# Patient Record
Sex: Male | Born: 1950
Health system: Southern US, Community
[De-identification: ages and names within clinical notes are randomized; demographics above are authoritative.]

## PROBLEM LIST (undated history)

## (undated) ENCOUNTER — Ambulatory Visit: Discharge status: Absent without leave | Payer: Medicare Other

## (undated) DIAGNOSIS — C801 Malignant (primary) neoplasm, unspecified: Secondary | ICD-10-CM

## (undated) DIAGNOSIS — I10 Essential (primary) hypertension: Secondary | ICD-10-CM

## (undated) DIAGNOSIS — J387 Other diseases of larynx: Secondary | ICD-10-CM

## (undated) DIAGNOSIS — Z972 Presence of dental prosthetic device (complete) (partial): Secondary | ICD-10-CM

## (undated) DIAGNOSIS — I499 Cardiac arrhythmia, unspecified: Secondary | ICD-10-CM

## (undated) DIAGNOSIS — H269 Unspecified cataract: Secondary | ICD-10-CM

## (undated) HISTORY — PX: HERNIA REPAIR: SHX51

## (undated) HISTORY — DX: Unspecified cataract: H26.9

## (undated) HISTORY — PX: CATARACT EXTRACTION, BILATERAL: SHX1313

---

## 2008-05-25 ENCOUNTER — Ambulatory Visit: Payer: Self-pay | Admitting: Gastroenterology

## 2012-01-23 ENCOUNTER — Ambulatory Visit: Payer: Self-pay | Admitting: Ophthalmology

## 2012-02-20 ENCOUNTER — Ambulatory Visit: Payer: Self-pay | Admitting: Ophthalmology

## 2016-05-21 HISTORY — PX: MELANOMA EXCISION: SHX5266

## 2016-10-11 DIAGNOSIS — L578 Other skin changes due to chronic exposure to nonionizing radiation: Secondary | ICD-10-CM | POA: Diagnosis not present

## 2016-10-11 DIAGNOSIS — R208 Other disturbances of skin sensation: Secondary | ICD-10-CM | POA: Diagnosis not present

## 2016-10-11 DIAGNOSIS — C434 Malignant melanoma of scalp and neck: Secondary | ICD-10-CM | POA: Diagnosis not present

## 2016-10-11 DIAGNOSIS — C4441 Basal cell carcinoma of skin of scalp and neck: Secondary | ICD-10-CM | POA: Diagnosis not present

## 2016-10-11 DIAGNOSIS — D485 Neoplasm of uncertain behavior of skin: Secondary | ICD-10-CM | POA: Diagnosis not present

## 2016-10-11 DIAGNOSIS — Z1283 Encounter for screening for malignant neoplasm of skin: Secondary | ICD-10-CM | POA: Diagnosis not present

## 2016-10-17 DIAGNOSIS — C434 Malignant melanoma of scalp and neck: Secondary | ICD-10-CM | POA: Diagnosis not present

## 2016-10-17 DIAGNOSIS — Z85828 Personal history of other malignant neoplasm of skin: Secondary | ICD-10-CM | POA: Diagnosis not present

## 2016-10-23 DIAGNOSIS — C4441 Basal cell carcinoma of skin of scalp and neck: Secondary | ICD-10-CM | POA: Diagnosis not present

## 2016-10-23 DIAGNOSIS — C434 Malignant melanoma of scalp and neck: Secondary | ICD-10-CM | POA: Diagnosis not present

## 2016-10-24 DIAGNOSIS — C4359 Malignant melanoma of other part of trunk: Secondary | ICD-10-CM | POA: Diagnosis not present

## 2016-11-06 DIAGNOSIS — C4359 Malignant melanoma of other part of trunk: Secondary | ICD-10-CM | POA: Diagnosis not present

## 2016-11-06 DIAGNOSIS — C4361 Malignant melanoma of right upper limb, including shoulder: Secondary | ICD-10-CM | POA: Diagnosis not present

## 2016-11-06 DIAGNOSIS — C434 Malignant melanoma of scalp and neck: Secondary | ICD-10-CM | POA: Diagnosis not present

## 2016-11-06 DIAGNOSIS — I10 Essential (primary) hypertension: Secondary | ICD-10-CM | POA: Diagnosis not present

## 2016-11-20 DIAGNOSIS — C434 Malignant melanoma of scalp and neck: Secondary | ICD-10-CM | POA: Diagnosis not present

## 2016-11-20 DIAGNOSIS — Z09 Encounter for follow-up examination after completed treatment for conditions other than malignant neoplasm: Secondary | ICD-10-CM | POA: Diagnosis not present

## 2016-12-18 DIAGNOSIS — Z09 Encounter for follow-up examination after completed treatment for conditions other than malignant neoplasm: Secondary | ICD-10-CM | POA: Diagnosis not present

## 2016-12-18 DIAGNOSIS — C434 Malignant melanoma of scalp and neck: Secondary | ICD-10-CM | POA: Diagnosis not present

## 2017-01-22 DIAGNOSIS — Z09 Encounter for follow-up examination after completed treatment for conditions other than malignant neoplasm: Secondary | ICD-10-CM | POA: Diagnosis not present

## 2017-01-22 DIAGNOSIS — C434 Malignant melanoma of scalp and neck: Secondary | ICD-10-CM | POA: Diagnosis not present

## 2017-09-12 ENCOUNTER — Encounter: Payer: Self-pay | Admitting: Anesthesiology

## 2017-09-12 DIAGNOSIS — D380 Neoplasm of uncertain behavior of larynx: Secondary | ICD-10-CM | POA: Diagnosis not present

## 2017-09-12 DIAGNOSIS — R070 Pain in throat: Secondary | ICD-10-CM | POA: Diagnosis not present

## 2017-09-16 NOTE — Discharge Instructions (Signed)
General Anesthesia, Adult, Care After °These instructions provide you with information about caring for yourself after your procedure. Your health care provider may also give you more specific instructions. Your treatment has been planned according to current medical practices, but problems sometimes occur. Call your health care provider if you have any problems or questions after your procedure. °What can I expect after the procedure? °After the procedure, it is common to have: °· Vomiting. °· A sore throat. °· Mental slowness. ° °It is common to feel: °· Nauseous. °· Cold or shivery. °· Sleepy. °· Tired. °· Sore or achy, even in parts of your body where you did not have surgery. ° °Follow these instructions at home: °For at least 24 hours after the procedure: °· Do not: °? Participate in activities where you could fall or become injured. °? Drive. °? Use heavy machinery. °? Drink alcohol. °? Take sleeping pills or medicines that cause drowsiness. °? Make important decisions or sign legal documents. °? Take care of children on your own. °· Rest. °Eating and drinking °· If you vomit, drink water, juice, or soup when you can drink without vomiting. °· Drink enough fluid to keep your urine clear or pale yellow. °· Make sure you have little or no nausea before eating solid foods. °· Follow the diet recommended by your health care provider. °General instructions °· Have a responsible adult stay with you until you are awake and alert. °· Return to your normal activities as told by your health care provider. Ask your health care provider what activities are safe for you. °· Take over-the-counter and prescription medicines only as told by your health care provider. °· If you smoke, do not smoke without supervision. °· Keep all follow-up visits as told by your health care provider. This is important. °Contact a health care provider if: °· You continue to have nausea or vomiting at home, and medicines are not helpful. °· You  cannot drink fluids or start eating again. °· You cannot urinate after 8-12 hours. °· You develop a skin rash. °· You have fever. °· You have increasing redness at the site of your procedure. °Get help right away if: °· You have difficulty breathing. °· You have chest pain. °· You have unexpected bleeding. °· You feel that you are having a life-threatening or urgent problem. °This information is not intended to replace advice given to you by your health care provider. Make sure you discuss any questions you have with your health care provider. °Document Released: 08/13/2000 Document Revised: 10/10/2015 Document Reviewed: 04/21/2015 °Elsevier Interactive Patient Education © 2018 [employer] ° °

## 2017-09-17 ENCOUNTER — Ambulatory Visit: Admission: RE | Admit: 2017-09-17 | Payer: Medicare Other | Source: Ambulatory Visit | Admitting: Otolaryngology

## 2017-09-17 HISTORY — DX: Essential (primary) hypertension: I10

## 2017-09-17 SURGERY — LARYNGOSCOPY, DIRECT
Anesthesia: General

## 2017-09-25 DIAGNOSIS — I493 Ventricular premature depolarization: Secondary | ICD-10-CM | POA: Diagnosis not present

## 2017-09-25 DIAGNOSIS — I10 Essential (primary) hypertension: Secondary | ICD-10-CM | POA: Diagnosis not present

## 2017-10-02 ENCOUNTER — Other Ambulatory Visit: Payer: Self-pay

## 2017-10-03 NOTE — Discharge Instructions (Signed)
General Anesthesia, Adult, Care After °These instructions provide you with information about caring for yourself after your procedure. Your health care provider may also give you more specific instructions. Your treatment has been planned according to current medical practices, but problems sometimes occur. Call your health care provider if you have any problems or questions after your procedure. °What can I expect after the procedure? °After the procedure, it is common to have: °· Vomiting. °· A sore throat. °· Mental slowness. ° °It is common to feel: °· Nauseous. °· Cold or shivery. °· Sleepy. °· Tired. °· Sore or achy, even in parts of your body where you did not have surgery. ° °Follow these instructions at home: °For at least 24 hours after the procedure: °· Do not: °? Participate in activities where you could fall or become injured. °? Drive. °? Use heavy machinery. °? Drink alcohol. °? Take sleeping pills or medicines that cause drowsiness. °? Make important decisions or sign legal documents. °? Take care of children on your own. °· Rest. °Eating and drinking °· If you vomit, drink water, juice, or soup when you can drink without vomiting. °· Drink enough fluid to keep your urine clear or pale yellow. °· Make sure you have little or no nausea before eating solid foods. °· Follow the diet recommended by your health care provider. °General instructions °· Have a responsible adult stay with you until you are awake and alert. °· Return to your normal activities as told by your health care provider. Ask your health care provider what activities are safe for you. °· Take over-the-counter and prescription medicines only as told by your health care provider. °· If you smoke, do not smoke without supervision. °· Keep all follow-up visits as told by your health care provider. This is important. °Contact a health care provider if: °· You continue to have nausea or vomiting at home, and medicines are not helpful. °· You  cannot drink fluids or start eating again. °· You cannot urinate after 8-12 hours. °· You develop a skin rash. °· You have fever. °· You have increasing redness at the site of your procedure. °Get help right away if: °· You have difficulty breathing. °· You have chest pain. °· You have unexpected bleeding. °· You feel that you are having a life-threatening or urgent problem. °This information is not intended to replace advice given to you by your health care provider. Make sure you discuss any questions you have with your health care provider. °Document Released: 08/13/2000 Document Revised: 10/10/2015 Document Reviewed: 04/21/2015 °Elsevier Interactive Patient Education © 2018 [employer] ° °

## 2017-10-08 ENCOUNTER — Encounter
Admission: RE | Disposition: A | Discharge status: Home / Self Care | Payer: Self-pay | Source: Ambulatory Visit | Attending: Otolaryngology

## 2017-10-08 ENCOUNTER — Ambulatory Visit
Admission: RE | Admit: 2017-10-08 | Discharge: 2017-10-08 | Disposition: A | Discharge status: Home / Self Care | Payer: Medicare Other | Source: Ambulatory Visit | Attending: Otolaryngology | Admitting: Otolaryngology

## 2017-10-08 ENCOUNTER — Ambulatory Visit: Payer: Medicare Other | Admitting: Anesthesiology

## 2017-10-08 DIAGNOSIS — Z85820 Personal history of malignant melanoma of skin: Secondary | ICD-10-CM | POA: Diagnosis not present

## 2017-10-08 DIAGNOSIS — F17200 Nicotine dependence, unspecified, uncomplicated: Secondary | ICD-10-CM | POA: Insufficient documentation

## 2017-10-08 DIAGNOSIS — D489 Neoplasm of uncertain behavior, unspecified: Secondary | ICD-10-CM | POA: Diagnosis not present

## 2017-10-08 DIAGNOSIS — Z79899 Other long term (current) drug therapy: Secondary | ICD-10-CM | POA: Insufficient documentation

## 2017-10-08 DIAGNOSIS — D380 Neoplasm of uncertain behavior of larynx: Secondary | ICD-10-CM | POA: Diagnosis not present

## 2017-10-08 DIAGNOSIS — J387 Other diseases of larynx: Secondary | ICD-10-CM | POA: Diagnosis not present

## 2017-10-08 DIAGNOSIS — C321 Malignant neoplasm of supraglottis: Secondary | ICD-10-CM | POA: Diagnosis not present

## 2017-10-08 DIAGNOSIS — I10 Essential (primary) hypertension: Secondary | ICD-10-CM | POA: Diagnosis not present

## 2017-10-08 HISTORY — DX: Malignant (primary) neoplasm, unspecified: C80.1

## 2017-10-08 HISTORY — DX: Cardiac arrhythmia, unspecified: I49.9

## 2017-10-08 HISTORY — PX: DIRECT LARYNGOSCOPY: SHX5326

## 2017-10-08 HISTORY — DX: Other diseases of larynx: J38.7

## 2017-10-08 HISTORY — DX: Presence of dental prosthetic device (complete) (partial): Z97.2

## 2017-10-08 SURGERY — LARYNGOSCOPY, DIRECT
Anesthesia: General | Wound class: Clean Contaminated

## 2017-10-08 MED ORDER — ACETAMINOPHEN 325 MG PO TABS: 325.0000 mg | ORAL_TABLET | ORAL | Status: DC | PRN

## 2017-10-08 MED ORDER — FENTANYL CITRATE (PF) 100 MCG/2ML IJ SOLN
INTRAMUSCULAR | Status: DC | PRN
  Administered 2017-10-08: 50 ug via INTRAVENOUS

## 2017-10-08 MED ORDER — PROPOFOL 10 MG/ML IV BOLUS
INTRAVENOUS | Status: DC | PRN
  Administered 2017-10-08: 150 mg via INTRAVENOUS
  Administered 2017-10-08: 30 mg via INTRAVENOUS

## 2017-10-08 MED ORDER — ACETAMINOPHEN 160 MG/5ML PO SOLN: 325.0000 mg | ORAL | Status: DC | PRN

## 2017-10-08 MED ORDER — OXYMETAZOLINE HCL 0.05 % NA SOLN
NASAL | Status: DC | PRN
  Administered 2017-10-08: 1 via TOPICAL

## 2017-10-08 MED ORDER — GLYCOPYRROLATE 0.2 MG/ML IJ SOLN
INTRAMUSCULAR | Status: DC | PRN
  Administered 2017-10-08: 0.2 mg via INTRAVENOUS

## 2017-10-08 MED ORDER — FENTANYL CITRATE (PF) 100 MCG/2ML IJ SOLN
25.0000 ug | INTRAMUSCULAR | Status: DC | PRN
  Administered 2017-10-08: 25 ug via INTRAVENOUS

## 2017-10-08 MED ORDER — HYDROCODONE-ACETAMINOPHEN 7.5-325 MG/15ML PO SOLN: ORAL | 0 refills | Status: DC

## 2017-10-08 MED ORDER — LACTATED RINGERS IV SOLN
INTRAVENOUS | Status: DC | PRN
  Administered 2017-10-08: 09:00:00 via INTRAVENOUS

## 2017-10-08 MED ORDER — ONDANSETRON HCL 4 MG/2ML IJ SOLN
INTRAMUSCULAR | Status: DC | PRN
  Administered 2017-10-08: 4 mg via INTRAVENOUS

## 2017-10-08 MED ORDER — LIDOCAINE HCL (CARDIAC) PF 100 MG/5ML IV SOSY
PREFILLED_SYRINGE | INTRAVENOUS | Status: DC | PRN
  Administered 2017-10-08: 40 mg via INTRATRACHEAL

## 2017-10-08 MED ORDER — DEXAMETHASONE SODIUM PHOSPHATE 10 MG/ML IJ SOLN
INTRAMUSCULAR | Status: DC | PRN
  Administered 2017-10-08: 4 mg via INTRAVENOUS

## 2017-10-08 MED ORDER — OXYCODONE HCL 5 MG PO TABS: 5.0000 mg | ORAL_TABLET | Freq: Once | ORAL | Status: AC | PRN

## 2017-10-08 MED ORDER — SUCCINYLCHOLINE CHLORIDE 20 MG/ML IJ SOLN
INTRAMUSCULAR | Status: DC | PRN
  Administered 2017-10-08: 100 mg via INTRAVENOUS

## 2017-10-08 MED ORDER — OXYCODONE HCL 5 MG/5ML PO SOLN
5.0000 mg | Freq: Once | ORAL | Status: AC | PRN
  Administered 2017-10-08: 5 mg via ORAL

## 2017-10-08 SURGICAL SUPPLY — 14 items
BLOCK BITE GUARD (MISCELLANEOUS) ×3 IMPLANT
COVER MAYO STAND STRL (DRAPES) ×3 IMPLANT
COVER TABLE BACK 60X90 (DRAPES) ×3 IMPLANT
CUP MEDICINE 2OZ PLAST GRAD ST (MISCELLANEOUS) ×3 IMPLANT
DRESSING TELFA 4X3 1S ST N-ADH (GAUZE/BANDAGES/DRESSINGS) ×3 IMPLANT
GLOVE BIO SURGEON STRL SZ7.5 (GLOVE) ×3 IMPLANT
KIT TURNOVER KIT A (KITS) ×3 IMPLANT
MARKER SKIN DUAL TIP RULER LAB (MISCELLANEOUS) IMPLANT
NEEDLE 18GX1X1/2 (RX/OR ONLY) (NEEDLE) IMPLANT
PATTIES SURGICAL .5 X.5 (GAUZE/BANDAGES/DRESSINGS) ×3 IMPLANT
SPONGE XRAY 4X4 16PLY STRL (MISCELLANEOUS) ×3 IMPLANT
TOWEL OR 17X26 4PK STRL BLUE (TOWEL DISPOSABLE) ×3 IMPLANT
TUBING CONN 6MMX3.1M (TUBING) ×2
TUBING SUCTION CONN 0.25 STRL (TUBING) ×1 IMPLANT

## 2017-10-08 NOTE — Anesthesia Procedure Notes (Signed)
Procedure Name: Intubation Date/Time: 10/08/2017 8:51 AM Performed by: Cameron Ali, CRNA Pre-anesthesia Checklist: Patient identified, Emergency Drugs available, Suction available, Patient being monitored and Timeout performed Patient Re-evaluated:Patient Re-evaluated prior to induction Oxygen Delivery Method: Circle system utilized Preoxygenation: Pre-oxygenation with 100% oxygen Induction Type: IV induction Ventilation: Mask ventilation without difficulty Grade View: Grade I Tube type: MLT Tube size: 6.0 mm Number of attempts: 1 Placement Confirmation: ETT inserted through vocal cords under direct vision,  positive ETCO2 and breath sounds checked- equal and bilateral Tube secured with: Tape Dental Injury: Teeth and Oropharynx as per pre-operative assessment

## 2017-10-08 NOTE — Anesthesia Postprocedure Evaluation (Signed)
Anesthesia Post Note  Patient: Bradley Edwards  Procedure(s) Performed: DIRECT LARYNGOSCOPY WITH BIOPSY OF EPIGLOTIS LESION (N/A )  Patient location during evaluation: PACU Anesthesia Type: General Level of consciousness: awake Pain management: pain level controlled Vital Signs Assessment: post-procedure vital signs reviewed and stable Respiratory status: respiratory function stable Cardiovascular status: stable Postop Assessment: no signs of nausea or vomiting Anesthetic complications: no    Veda Canning

## 2017-10-08 NOTE — Anesthesia Preprocedure Evaluation (Addendum)
Anesthesia Evaluation  Patient identified by MRN, date of birth, ID band Patient awake    Reviewed: Allergy & Precautions, NPO status , Patient's Chart, lab work & pertinent test results  Airway Mallampati: II  TM Distance: >3 FB    Comment: [place]  (+) Upper Dentures, Lower Dentures   Pulmonary neg recent URI, Current Smoker (smokes 1.5 packs per day. Smoked today.),  Laryngeal lesion   breath sounds clear to auscultation       Cardiovascular hypertension, (-) angina+ dysrhythmias (frequent PVC's)  Rhythm:Regular Rate:Normal  Cardiac clearance on chart   Neuro/Psych negative neurological ROS  negative psych ROS   GI/Hepatic neg GERD  ,  Endo/Other    Renal/GU      Musculoskeletal   Abdominal   Peds  Hematology negative hematology ROS (+)   Anesthesia Other Findings   Reproductive/Obstetrics                           Anesthesia Physical Anesthesia Plan  ASA: III  Anesthesia Plan: General   Post-op Pain Management:    Induction: Intravenous  PONV Risk Score and Plan: 1 and Ondansetron  Airway Management Planned: Oral ETT  Additional Equipment:   Intra-op Plan:   Post-operative Plan:   Informed Consent: I have reviewed the patients History and Physical, chart, labs and discussed the procedure including the risks, benefits and alternatives for the proposed anesthesia with the patient or authorized representative who has indicated his/her understanding and acceptance.   Dental advisory given  Plan Discussed with: CRNA  Anesthesia Plan Comments:       Anesthesia Quick Evaluation

## 2017-10-08 NOTE — Transfer of Care (Signed)
Immediate Anesthesia Transfer of Care Note  Patient: Bradley Edwards  Procedure(s) Performed: DIRECT LARYNGOSCOPY WITH BIOPSY OF EPIGLOTIS LESION (N/A )  Patient Location: PACU  Anesthesia Type: General  Level of Consciousness: awake, alert  and patient cooperative  Airway and Oxygen Therapy: Patient Spontanous Breathing and Patient connected to supplemental oxygen  Post-op Assessment: Post-op Vital signs reviewed, Patient's Cardiovascular Status Stable, Respiratory Function Stable, Patent Airway and No signs of Nausea or vomiting  Post-op Vital Signs: Reviewed and stable  Complications: No apparent anesthesia complications

## 2017-10-08 NOTE — Op Note (Signed)
10/08/2017  9:08 AM    Bradley Edwards  [id number]    Pre-Op Diagnosis:  EPIGLOTTIS LESION, NEOPLASM OF UNCERTAIN BEHAVIOR  Post-op Diagnosis: Same  Procedure:  Direct Laryngoscopy with Biopsy  Surgeon:  Riley Nearing., MD  Anesthesia:  General Endotracheal  EBL:  Minimal  Complications:  None  Findings:  8-2mm friable lesion of the mid laryngeal surface of the epiglottis, to the left of midline, extending to the midline. TVC and FVC are clear. No lesions of hypopharynx or tongue base  Procedure: With the patient in a comfortable supine position, general endotracheal anesthesia was induced without difficulty.  At an appropriate level, the table was turned 90 degrees away from Anesthesia.  A clean preparation and draping was performed in the standard fashion. The oropharynx, oral cavity, and hypopharynx were palpated with no palpable lesions, including in the tongue base.  A tooth guard was placed. Using the Dedo laryngoscope, the oropharynx, hypopharynx and larynx were carefully inspected. The patient was placed into suspension with the scope positioned just above the lesion on the epiglottis. He was somewhat anterior, so a 30 degree scope was used to aid visualization. Photo-documentation of the lesion was obtained.   Multiple biopsies were taken from the laryngeal surface of the epiglottis. Bleeding was controlled with Afrin moistened pledgets.  The findings were as described above.  The laryngoscope was removed.  The neck was palpated on both sides with no nodes palpable.  At this point the procedure was completed.  Dental status was intact.  The patient was returned to Anesthesia, awakened, extubated, and transferred to PACU in satisfactory condition.   Disposition: To PACU, then discharge home  Plan: Soft, bland diet, advance as tolerated. Take pain medications as prescribed. Follow-up to be scheduled after biopsy results obtained.   Riley Nearing 10/08/2017 9:08  AM

## 2017-10-08 NOTE — H&P (Signed)
History and physical reviewed and will be scanned in later. No change in medical status reported by the patient or family, appears stable for surgery. All questions regarding the procedure answered, and patient (or family if a child) expressed understanding of the procedure. ? ?Bradley Edwards ?@TODAY@ ?

## 2017-10-09 ENCOUNTER — Encounter: Payer: Self-pay | Admitting: Otolaryngology

## 2017-10-10 LAB — SURGICAL PATHOLOGY

## 2017-10-18 ENCOUNTER — Encounter (INDEPENDENT_AMBULATORY_CARE_PROVIDER_SITE_OTHER): Payer: Self-pay

## 2017-10-18 ENCOUNTER — Inpatient Hospital Stay: Payer: Medicare Other

## 2017-10-18 ENCOUNTER — Inpatient Hospital Stay: Payer: Medicare Other | Attending: Oncology | Admitting: Oncology

## 2017-10-18 ENCOUNTER — Encounter: Payer: Self-pay | Admitting: Oncology

## 2017-10-18 ENCOUNTER — Other Ambulatory Visit: Payer: Self-pay

## 2017-10-18 VITALS — BP 142/60 | HR 50 | Temp 98.3°F | Resp 16 | Ht 66.0 in | Wt 159.3 lb

## 2017-10-18 DIAGNOSIS — F17210 Nicotine dependence, cigarettes, uncomplicated: Secondary | ICD-10-CM | POA: Diagnosis not present

## 2017-10-18 DIAGNOSIS — C760 Malignant neoplasm of head, face and neck: Secondary | ICD-10-CM

## 2017-10-18 DIAGNOSIS — C321 Malignant neoplasm of supraglottis: Secondary | ICD-10-CM

## 2017-10-18 DIAGNOSIS — Z716 Tobacco abuse counseling: Secondary | ICD-10-CM

## 2017-10-18 LAB — COMPREHENSIVE METABOLIC PANEL
ALBUMIN: 4.4 g/dL (ref 3.5–5.0)
ALT: 15 U/L — ABNORMAL LOW (ref 17–63)
AST: 16 U/L (ref 15–41)
Alkaline Phosphatase: 79 U/L (ref 38–126)
Anion gap: 8 (ref 5–15)
BILIRUBIN TOTAL: 1.4 mg/dL — AB (ref 0.3–1.2)
BUN: 17 mg/dL (ref 6–20)
CO2: 25 mmol/L (ref 22–32)
Calcium: 9 mg/dL (ref 8.9–10.3)
Chloride: 101 mmol/L (ref 101–111)
Creatinine, Ser: 1.02 mg/dL (ref 0.61–1.24)
GFR calc Af Amer: >60 mL/min (ref >60)
GFR calc non Af Amer: >60 mL/min (ref >60)
Glucose, Bld: 118 mg/dL — ABNORMAL HIGH (ref 65–99)
POTASSIUM: 5.1 mmol/L (ref 3.5–5.1)
Sodium: 134 mmol/L — ABNORMAL LOW (ref 135–145)
TOTAL PROTEIN: 7.5 g/dL (ref 6.5–8.1)

## 2017-10-18 LAB — CBC WITH DIFFERENTIAL/PLATELET
BASOS PCT: 1 %
Basophils Absolute: 0.1 10*3/uL (ref 0–0.1)
Eosinophils Absolute: 0.2 10*3/uL (ref 0–0.7)
Eosinophils Relative: 2 %
HEMATOCRIT: 49.8 % (ref 40.0–52.0)
Hemoglobin: 17 g/dL (ref 13.0–18.0)
Lymphocytes Relative: 18 %
Lymphs Abs: 1.9 10*3/uL (ref 1.0–3.6)
MCH: 31.4 pg (ref 26.0–34.0)
MCHC: 34.2 g/dL (ref 32.0–36.0)
MCV: 91.9 fL (ref 80.0–100.0)
MONO ABS: 1 10*3/uL (ref 0.2–1.0)
MONOS PCT: 9 %
NEUTROS ABS: 7.3 10*3/uL — AB (ref 1.4–6.5)
Neutrophils Relative %: 70 %
Platelets: 191 10*3/uL (ref 150–440)
RBC: 5.42 MIL/uL (ref 4.40–5.90)
RDW: 14.7 % — AB (ref 11.5–14.5)
WBC: 10.5 10*3/uL (ref 3.8–10.6)

## 2017-10-18 NOTE — Progress Notes (Signed)
Patient here today as a new patient  

## 2017-10-20 ENCOUNTER — Encounter: Payer: Self-pay | Admitting: Oncology

## 2017-10-20 NOTE — Progress Notes (Signed)
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   HISTORY OF PRESENTING ILLNESS:  Bradley Edwards is a  67 y.o.  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  Patient reports having sore throat and went to ENT for evaluation with laryngoscopy found to have a mass on epiglottis.  Biopsy pathology showed papillary squamous carcinoma of epiglottis. Refer to cancer center to discuss pathology and further management plan.   No difficulty with swallowing and no hoarseness. Current active daily smoker.     Review of Systems  Constitutional: Negative for chills, fever, malaise/fatigue and weight loss.  HENT: Positive for sore throat. Negative for congestion, ear discharge, ear pain, nosebleeds and sinus pain.   Eyes: Negative for double vision, photophobia, pain, discharge and redness.  Respiratory: Positive for cough. Negative for hemoptysis, sputum production, shortness of breath and wheezing.   Cardiovascular: Negative for chest pain, palpitations, orthopnea, claudication and leg swelling.  Gastrointestinal: Negative for abdominal pain, blood in stool, constipation, diarrhea, heartburn, melena, nausea and vomiting.  Genitourinary: Negative for dysuria, flank pain, frequency and hematuria.  Musculoskeletal: Negative for back pain, myalgias and neck pain.  Skin: Negative for itching and rash.  Neurological: Negative for dizziness, tingling, tremors, focal weakness, weakness and headaches.  Endo/Heme/Allergies: Negative for environmental allergies. Does not bruise/bleed easily.  Psychiatric/Behavioral: Negative for depression and hallucinations. The patient is not  nervous/anxious.     MEDICAL HISTORY:  Past Medical History:  Diagnosis Date  . Cancer (HCC)    skin-melanoma  . Cataract   . Dysrhythmia    PVC, cardiac clearance Dr Ubaldo Glassing 09/25/2017  . Hypertension    controlled on med  . Lesion of larynx   . Wears dentures    upper and lower    SURGICAL HISTORY: Past Surgical History:  Procedure Laterality Date  . CATARACT EXTRACTION, BILATERAL    . DIRECT LARYNGOSCOPY N/A 10/08/2017   Procedure: DIRECT LARYNGOSCOPY WITH BIOPSY OF EPIGLOTIS LESION;  Surgeon: Clyde Canterbury, MD;  Location: [facility];  Service: ENT;  Laterality: N/A;  . HERNIA REPAIR     umbilical  . MELANOMA EXCISION  2018    SOCIAL HISTORY: Social History   Socioeconomic History  . Marital status: Married    Spouse name: Bessie   . Number of children: 1  . Years of education: Not on file  . Highest education level: Not on file  Occupational History  . Not on file  Social Needs  . Financial resource strain: Not on file  . Food insecurity:    Worry: Not on file    Inability: Not on file  . Transportation needs:    Medical: Not on file    Non-medical: Not on file  Tobacco Use  . Smoking status: Current Every Day Smoker    Packs/day: 1.50    Years: 50.00    Pack years: 75.00    Types: Cigarettes  . Smokeless tobacco: Never Used  Substance and Sexual Activity  . Alcohol use: Not Currently  . Drug use: Not Currently  . Sexual activity: Not on file  Lifestyle  . Physical activity:    Days per week: Not on file    Minutes per session: Not on file  . Stress: Not on  file  Relationships  . Social connections:    Talks on phone: Not on file    Gets together: Not on file    Attends religious service: Not on file    Active member of club or organization: Not on file    Attends meetings of clubs or organizations: Not on file    Relationship status: Not on file  . Intimate partner violence:    Fear of current or ex partner: Not on file    Emotionally  abused: Not on file    Physically abused: Not on file    Forced sexual activity: Not on file  Other Topics Concern  . Not on file  Social History Narrative  . Not on file    FAMILY HISTORY: Family History  Problem Relation Age of Onset  . COPD Mother   . Cancer Paternal Uncle        Unknow type of cancer     ALLERGIES:  has No Known Allergies.  MEDICATIONS:  Current Outpatient Medications  Medication Sig Dispense Refill  . amLODipine (NORVASC) 10 MG tablet Take 10 mg by mouth daily. am    . lisinopril (PRINIVIL,ZESTRIL) 40 MG tablet Take 40 mg by mouth daily. am     No current facility-administered medications for this visit.      PHYSICAL EXAMINATION: ECOG PERFORMANCE STATUS: 0 - Asymptomatic Vitals:   10/18/17 1455  BP: (!) 142/60  Pulse: (!) 50  Resp: 16  Temp: 98.3 F (36.8 C)  SpO2: 97%   Filed Weights   10/18/17 1455  Weight: 159 lb 5 oz (72.3 kg)    Physical Exam  Constitutional: He is oriented to person, place, and time. He appears well-developed and well-nourished. No distress.  HENT:  Head: Normocephalic and atraumatic.  Right Ear: External ear normal.  Left Ear: External ear normal.  Eyes: Pupils are equal, round, and reactive to light. Conjunctivae and EOM are normal. No scleral icterus.  Neck: Normal range of motion. Neck supple.  Cardiovascular: Normal rate, regular rhythm and normal heart sounds.  Pulmonary/Chest: Effort normal and breath sounds normal. No respiratory distress. He has no wheezes. He has no rales. He exhibits no tenderness.  Abdominal: Soft. Bowel sounds are normal. He exhibits no distension and no mass. There is no tenderness.  Musculoskeletal: Normal range of motion. He exhibits no edema or deformity.  Lymphadenopathy:    He has no cervical adenopathy.  Neurological: He is alert and oriented to person, place, and time. No cranial nerve deficit. Coordination normal.  Skin: Skin is warm and dry. No rash noted.  Psychiatric:  He has a normal mood and affect. His behavior is normal. Thought content normal.     LABORATORY DATA:  I have reviewed the data as listed Lab Results  Component Value Date   WBC 10.5 10/18/2017   HGB 17.0 10/18/2017   HCT 49.8 10/18/2017   MCV 91.9 10/18/2017   PLT 191 10/18/2017   Recent Labs    10/18/17 1535  NA 134*  K 5.1  CL 101  CO2 25  GLUCOSE 118*  BUN 17  CREATININE 1.02  CALCIUM 9.0  GFRNONAA >60  GFRAA >60  PROT 7.5  ALBUMIN 4.4  AST 16  ALT 15*  ALKPHOS 79  BILITOT 1.4*       ASSESSMENT & PLAN:  1. Tobacco abuse counseling   2. Malignant neoplasm of head, face and neck ([place])   3. Squamous cell carcinoma of epiglottis (HCC)  Pathology results discussed with patient. Discuss about cancer diagnosis and explained about the steps of determining cancer extent and future plans.  Will obtain PET scan to see if any nodal involvement and distant metastasis.  Most likey need Radiation +/- chemotherapy. Will refer to RadOnc. # Smoking cessation was discussed with patient and cessation program information was given to patient.  # Will obtain CBC and CMP today.   All questions were answered. The patient knows to call the clinic with any problems questions or concerns.  Return of visit:  Thank you for this kind referral and the opportunity to participate in the care of this patient. A copy of today's note is routed to referring provider  Total face to face encounter time for this patient visit was 60 min. >50% of the time was  spent in counseling and coordination of care.    Earlie Server, MD, PhD Hematology Oncology [facility] at [facility] Pager- [phone]

## 2017-10-21 DIAGNOSIS — C329 Malignant neoplasm of larynx, unspecified: Secondary | ICD-10-CM | POA: Diagnosis not present

## 2017-10-22 ENCOUNTER — Encounter
Admission: RE | Admit: 2017-10-22 | Discharge: 2017-10-22 | Disposition: A | Discharge status: Home / Self Care | Payer: Medicare Other | Source: Ambulatory Visit | Attending: Oncology | Admitting: Oncology

## 2017-10-22 DIAGNOSIS — C760 Malignant neoplasm of head, face and neck: Secondary | ICD-10-CM | POA: Diagnosis not present

## 2017-10-22 LAB — GLUCOSE, CAPILLARY: Glucose-Capillary: 102 mg/dL — ABNORMAL HIGH (ref 65–99)

## 2017-10-22 MED ORDER — FLUDEOXYGLUCOSE F - 18 (FDG) INJECTION
9.0300 | Freq: Once | INTRAVENOUS | Status: AC | PRN
  Administered 2017-10-22: 9.03 via INTRAVENOUS

## 2017-10-24 ENCOUNTER — Encounter: Payer: Self-pay | Admitting: Oncology

## 2017-10-24 ENCOUNTER — Encounter: Payer: Self-pay | Admitting: Radiation Oncology

## 2017-10-24 ENCOUNTER — Ambulatory Visit
Admission: RE | Admit: 2017-10-24 | Discharge: 2017-10-24 | Disposition: A | Discharge status: Home / Self Care | Payer: Medicare Other | Source: Ambulatory Visit | Attending: Radiation Oncology | Admitting: Radiation Oncology

## 2017-10-24 ENCOUNTER — Inpatient Hospital Stay: Payer: Medicare Other | Attending: Oncology | Admitting: Oncology

## 2017-10-24 ENCOUNTER — Other Ambulatory Visit: Payer: Self-pay

## 2017-10-24 VITALS — BP 150/74 | HR 65 | Temp 97.2°F | Resp 16 | Ht 66.0 in | Wt 164.0 lb

## 2017-10-24 VITALS — BP 154/78 | HR 73 | Temp 96.7°F | Resp 20 | Wt 164.4 lb

## 2017-10-24 DIAGNOSIS — Z716 Tobacco abuse counseling: Secondary | ICD-10-CM

## 2017-10-24 DIAGNOSIS — Z79899 Other long term (current) drug therapy: Secondary | ICD-10-CM | POA: Diagnosis not present

## 2017-10-24 DIAGNOSIS — C321 Malignant neoplasm of supraglottis: Secondary | ICD-10-CM | POA: Insufficient documentation

## 2017-10-24 DIAGNOSIS — I10 Essential (primary) hypertension: Secondary | ICD-10-CM | POA: Diagnosis not present

## 2017-10-24 DIAGNOSIS — F17210 Nicotine dependence, cigarettes, uncomplicated: Secondary | ICD-10-CM | POA: Diagnosis not present

## 2017-10-24 DIAGNOSIS — I499 Cardiac arrhythmia, unspecified: Secondary | ICD-10-CM | POA: Insufficient documentation

## 2017-10-24 NOTE — Progress Notes (Signed)
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   HISTORY OF PRESENTING ILLNESS:  Bradley Edwards is a  67 y.o.  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  Patient reports having sore throat and went to ENT for evaluation with laryngoscopy found to have a mass on epiglottis.  Biopsy pathology showed papillary squamous carcinoma of epiglottis. Refer to cancer center to discuss pathology and further management plan.   No difficulty with swallowing and no hoarseness. Current active daily smoker, 75pk/year.   INTERVAL HISTORY Bradley Edwards is a 67 y.o. male who has above history reviewed by me today presents for follow up visit for management of papillary squamous carcinoma of epiglottis.  Problems and complaints are listed below: Sore throat: Continued to have sore throat. papillary squamous carcinoma of epiglottis.  During the interval he had PET scan done.  Present to discuss about management plan.  I reviewed operation note from ENTR surgeon: Dr. Clyde Canterbury.  Direct laryngoscopy showed 8 to 10 mm friable lesion of mid laryngeal surface of the epiglottis, to the left of midline, extending to the midline.  TVC and FVC are clear.  No lesion of the hypopharynx or tongue base.  Multiple biopsies were taken from the laryngeal surface of the epiglottis.    Review of Systems  Constitutional: Negative for chills, fever, malaise/fatigue and weight loss.  HENT: Positive for sore throat. Negative for congestion, ear discharge, ear pain, nosebleeds and sinus pain.   Eyes: Negative for double vision, photophobia, pain, discharge and redness.  Respiratory: Positive for cough. Negative for  hemoptysis, sputum production, shortness of breath and wheezing.   Cardiovascular: Negative for chest pain, palpitations, orthopnea, claudication and leg swelling.  Gastrointestinal: Negative for abdominal pain, blood in stool, constipation, diarrhea, heartburn, melena, nausea and vomiting.  Genitourinary: Negative for dysuria, flank pain, frequency and hematuria.  Musculoskeletal: Negative for back pain, myalgias and neck pain.  Skin: Negative for itching and rash.  Neurological: Negative for dizziness, tingling, tremors, focal weakness, weakness and headaches.  Endo/Heme/Allergies: Negative for environmental allergies. Does not bruise/bleed easily.  Psychiatric/Behavioral: Negative for depression and hallucinations. The patient is not nervous/anxious.     MEDICAL HISTORY:  Past Medical History:  Diagnosis Date  . Cancer (HCC)    skin-melanoma  . Cataract   . Dysrhythmia    PVC, cardiac clearance Dr Ubaldo Glassing 09/25/2017  . Hypertension    controlled on med  . Lesion of larynx   . Wears dentures    upper and lower    SURGICAL HISTORY: Past Surgical History:  Procedure Laterality Date  . CATARACT EXTRACTION, BILATERAL    . DIRECT LARYNGOSCOPY N/A 10/08/2017   Procedure: DIRECT LARYNGOSCOPY WITH BIOPSY OF EPIGLOTIS LESION;  Surgeon: Clyde Canterbury, MD;  Location: [place];  Service: ENT;  Laterality: N/A;  . HERNIA REPAIR     umbilical  . MELANOMA EXCISION  2018    SOCIAL HISTORY: Social History   Socioeconomic History  . Marital status: Married    Spouse name: Bessie   . Number of children: 1  . Years of education: Not on file  . Highest education level: Not on file  Occupational History  . Not on file  Social Needs  .  Financial resource strain: Not on file  . Food insecurity:    Worry: Not on file    Inability: Not on file  . Transportation needs:    Medical: Not on file    Non-medical: Not on file  Tobacco Use  . Smoking status: Current Every Day Smoker     Packs/day: 1.50    Years: 50.00    Pack years: 75.00    Types: Cigarettes  . Smokeless tobacco: Never Used  Substance and Sexual Activity  . Alcohol use: Not Currently  . Drug use: Not Currently  . Sexual activity: Not on file  Lifestyle  . Physical activity:    Days per week: Not on file    Minutes per session: Not on file  . Stress: Not on file  Relationships  . Social connections:    Talks on phone: Not on file    Gets together: Not on file    Attends religious service: Not on file    Active member of club or organization: Not on file    Attends meetings of clubs or organizations: Not on file    Relationship status: Not on file  . Intimate partner violence:    Fear of current or ex partner: Not on file    Emotionally abused: Not on file    Physically abused: Not on file    Forced sexual activity: Not on file  Other Topics Concern  . Not on file  Social History Narrative  . Not on file    FAMILY HISTORY: Family History  Problem Relation Age of Onset  . COPD Mother   . Cancer Paternal Uncle        Unknow type of cancer     ALLERGIES:  has No Known Allergies.  MEDICATIONS:  Current Outpatient Medications  Medication Sig Dispense Refill  . amLODipine (NORVASC) 10 MG tablet Take 10 mg by mouth daily. am    . lisinopril (PRINIVIL,ZESTRIL) 40 MG tablet Take 40 mg by mouth daily. am     No current facility-administered medications for this visit.      PHYSICAL EXAMINATION: ECOG PERFORMANCE STATUS: 0 - Asymptomatic Vitals:   10/24/17 1417  BP: (!) 150/74  Pulse: 65  Resp: 16  Temp: (!) 97.2 F (36.2 C)   Filed Weights   10/24/17 1417  Weight: 164 lb (74.4 kg)    Physical Exam  Constitutional: He is oriented to person, place, and time. He appears well-developed and well-nourished. No distress.  HENT:  Head: Normocephalic and atraumatic.  Right Ear: External ear normal.  Left Ear: External ear normal.  Mouth/Throat: Oropharynx is clear and moist.    Eyes: Pupils are equal, round, and reactive to light. Conjunctivae and EOM are normal. No scleral icterus.  Neck: Normal range of motion. Neck supple.  Cardiovascular: Normal rate, regular rhythm and normal heart sounds.  Pulmonary/Chest: Effort normal and breath sounds normal. No respiratory distress. He has no wheezes. He has no rales. He exhibits no tenderness.  Abdominal: Soft. Bowel sounds are normal. He exhibits no distension and no mass. There is no tenderness.  Musculoskeletal: Normal range of motion. He exhibits no edema or deformity.  Lymphadenopathy:    He has no cervical adenopathy.  Neurological: He is alert and oriented to person, place, and time. No cranial nerve deficit. Coordination normal.  Skin: Skin is warm and dry. No rash noted.  Psychiatric: He has a normal mood and affect. His behavior is normal. Thought content normal.  LABORATORY DATA:  I have reviewed the data as listed Lab Results  Component Value Date   WBC 10.5 10/18/2017   HGB 17.0 10/18/2017   HCT 49.8 10/18/2017   MCV 91.9 10/18/2017   PLT 191 10/18/2017   Recent Labs    10/18/17 1535  NA 134*  K 5.1  CL 101  CO2 25  GLUCOSE 118*  BUN 17  CREATININE 1.02  CALCIUM 9.0  GFRNONAA >60  GFRAA >60  PROT 7.5  ALBUMIN 4.4  AST 16  ALT 15*  ALKPHOS 79  BILITOT 1.4*    RADIOGRAPHIC STUDIES: I have personally reviewed the radiological images as listed and agreed with the findings in the report. 10/22/2017 PET scan 1. No hypermetabolism is seen within the epiglottis or remainder of the pharyngeal mucosal space. 2. No evidence of metastatic disease.  ASSESSMENT & PLAN:  1. Squamous cell carcinoma of epiglottis (HCC)   2. Tobacco abuse counseling   Cancer Staging Squamous cell carcinoma of epiglottis ([place]) Staging form: Larynx - Supraglottis, AJCC 8th Edition - Clinical stage from 10/24/2017: Stage I (cT1, cN0, cM0) - Signed by Earlie Server, MD on 10/24/2017   #Patient's case was discussed on  tumor board.  Consensus decision is definitive radiation.  PET scan was independently reviewed by me and discussed with patient. PET scan did not show any hypermetabolic activity within the epiglottis or remainder of the pharyngeal mucosal space.  No evidence of metastatic disease. Patient is clinically stage I supra glottic larynx papillary squamous cell carcinoma Discussed with patient that I recommend definitive radiation.  Would not offer chemotherapy at this point.  Goal of care is curative intent. Refer patient to see Dr. Donella Stade radiation oncology.  After radiation, recommend patient follow-up with radiation oncologist and ENT surgeon for treatment response assessment and surveillance.  I will sign off.  I will be happy to see patient in future if needed.  # # Smoking cessation was discussed with patient and cessation program information was given to patient. Refer to lung caner screening program.  All questions were answered. The patient knows to call the clinic with any problems questions or concerns.  Return of visit:  Thank you for this kind referral and the opportunity to participate in the care of this patient. A copy of today's note is routed to referring provider  Total face to face encounter time for this patient visit was 40 min. >50% of the time was  spent in counseling and coordination of care.    Earlie Server, MD, PhD Hematology Oncology [facility] at [facility] Pager- [phone]

## 2017-10-24 NOTE — Consult Note (Signed)
NEW PATIENT EVALUATION  Name: Bradley Edwards  MRN: [id number]  Date:   10/24/2017     DOB: [birth year]   This 67 y.o. male patient presents to the clinic for initial evaluation ofstage I (T1 N0 M0) squamous cell carcinoma of the epiglottis papillary type.  REFERRING PHYSICIAN: Jennette Kettle, MD  CHIEF COMPLAINT:  Chief Complaint  Patient presents with  . Cancer    Pt is here for initial consult for epiglottis     DIAGNOSIS: The encounter diagnosis was Cancer, epiglottis ([place]).   PREVIOUS INVESTIGATIONS:  Pathology reports reviewed PET CT scan reviewed Clinical notes reviewed Case presented at weekly tumor conference  HPI: patient is a 67 year old male chronic smoker who presented with a sore throat over the past several months. He came to ENTs attention and was found to have a mass of the epiglottis. Mass was 8-10 mm friable lesion in the mid laryngeal surface of the epiglottis to the left of midline extending to the midline. TVC and FVC were clear.biopsy was positive for papillary squamous cell carcinoma of the laryngeal surface. PET CT scan was performed showing no evidence of hypermetabolic activity in the head and neck region epiglottic region neck or distant disease. Case has been presented at our weekly tumor conference with recognition for radiation therapy Patient is seen today for opinion he is doing well he states his throat is still somewhat sore. He is not a big eater although his weight has been stable. He continues to smoke.  PLANNED TREATMENT REGIMEN: external beam I MRT radiation therapy  PAST MEDICAL HISTORY:  has a past medical history of Cancer ([place]), Cataract, Dysrhythmia, Hypertension, Lesion of larynx, and Wears dentures.    PAST SURGICAL HISTORY:  Past Surgical History:  Procedure Laterality Date  . CATARACT EXTRACTION, BILATERAL    . DIRECT LARYNGOSCOPY N/A 10/08/2017   Procedure: DIRECT LARYNGOSCOPY WITH BIOPSY OF EPIGLOTIS LESION;  Surgeon: Clyde Canterbury, MD;  Location: [place];  Service: ENT;  Laterality: N/A;  . HERNIA REPAIR     umbilical  . MELANOMA EXCISION  2018    FAMILY HISTORY: family history includes COPD in his mother; Cancer in his paternal uncle.  SOCIAL HISTORY:  reports that he has been smoking cigarettes.  He has a 75.00 pack-year smoking history. He has never used smokeless tobacco. He reports that he drank alcohol. He reports that he has current or past drug history.  ALLERGIES: Patient has no known allergies.  MEDICATIONS:  Current Outpatient Medications  Medication Sig Dispense Refill  . amLODipine (NORVASC) 10 MG tablet Take 10 mg by mouth daily. am    . lisinopril (PRINIVIL,ZESTRIL) 40 MG tablet Take 40 mg by mouth daily. am     No current facility-administered medications for this encounter.     ECOG PERFORMANCE STATUS:  1 - Symptomatic but completely ambulatory  REVIEW OF SYSTEMS: except for the sore throat Patient denies any weight loss, fatigue, weakness, fever, chills or night sweats. Patient denies any loss of vision, blurred vision. Patient denies any ringing  of the ears or hearing loss. No irregular heartbeat. Patient denies heart murmur or history of fainting. Patient denies any chest pain or pain radiating to her upper extremities. Patient denies any shortness of breath, difficulty breathing at night, cough or hemoptysis. Patient denies any swelling in the lower legs. Patient denies any nausea vomiting, vomiting of blood, or coffee ground material in the vomitus. Patient denies any stomach pain. Patient states has had normal bowel movements  no significant constipation or diarrhea. Patient denies any dysuria, hematuria or significant nocturia. Patient denies any problems walking, swelling in the joints or loss of balance. Patient denies any skin changes, loss of hair or loss of weight. Patient denies any excessive worrying or anxiety or significant depression. Patient denies any problems with  insomnia. Patient denies excessive thirst, polyuria, polydipsia. Patient denies any swollen glands, patient denies easy bruising or easy bleeding. Patient denies any recent infections, allergies or URI. Patient "s visual fields have not changed significantly in recent time.    PHYSICAL EXAM: BP (!) 154/78   Pulse 73   Temp (!) 96.7 F (35.9 C)   Resp 20   Wt 164 lb 5.7 oz (74.6 kg)   BMI 26.53 kg/m  Oral cavity is clear. No oral mucosal lesions are identified. Indirect mirror examination shows upper airway clear vallecula and base of tongue within normal limits neck is clear without evidence of subject gastric cervical or supraclavicular adenopathy. Well-developed well-nourished patient in NAD. HEENT reveals PERLA, EOMI, discs not visualized.  Oral cavity is clear. No oral mucosal lesions are identified. Neck is clear without evidence of cervical or supraclavicular adenopathy. Lungs are clear to A&P. Cardiac examination is essentially unremarkable with regular rate and rhythm without murmur rub or thrill. Abdomen is benign with no organomegaly or masses noted. Motor sensory and DTR levels are equal and symmetric in the upper and lower extremities. Cranial nerves II through XII are grossly intact. Proprioception is intact. No peripheral adenopathy or edema is identified. No motor or sensory levels are noted. Crude visual fields are within normal range.  LABORATORY DATA: pathology reports reviewed    RADIOLOGY RESULTS:PET CT scan reviewed   IMPRESSION: stage I papillary squamous cell carcinoma the epiglottis in 67 year old chronic smoker  PLAN: at this time I have recommended radiation therapy to his epiglottis. Would plan on delivering 6600 cGy to his epiglottic region as well as local regional lymph nodes which I would dose painting toapproximate 5000 cGy. Risks and benefits of treatment including increased dysphasia sore throat skin reaction fatigue alteration of blood counts all were  discussed in detail with the patient and his family. I have personally set up and ordered CT simulation for next week. Patient will be seen by medical oncology although with the early-stage disease do not believe chemotherapy will be recommended. Patient comprehends my treatment plan well.  I would like to take this opportunity to thank you for allowing me to participate in the care of your patient.Noreene Filbert, MD

## 2017-10-30 ENCOUNTER — Ambulatory Visit: Payer: Medicare Other

## 2017-10-31 ENCOUNTER — Ambulatory Visit
Admission: RE | Admit: 2017-10-31 | Discharge: 2017-10-31 | Disposition: A | Discharge status: Home / Self Care | Payer: Medicare Other | Source: Ambulatory Visit | Attending: Radiation Oncology | Admitting: Radiation Oncology

## 2017-10-31 DIAGNOSIS — Z510 Encounter for antineoplastic radiation therapy: Secondary | ICD-10-CM | POA: Insufficient documentation

## 2017-10-31 DIAGNOSIS — C321 Malignant neoplasm of supraglottis: Secondary | ICD-10-CM | POA: Diagnosis not present

## 2017-10-31 DIAGNOSIS — F17210 Nicotine dependence, cigarettes, uncomplicated: Secondary | ICD-10-CM | POA: Insufficient documentation

## 2017-10-31 DIAGNOSIS — Z85820 Personal history of malignant melanoma of skin: Secondary | ICD-10-CM | POA: Insufficient documentation

## 2017-10-31 DIAGNOSIS — Z79899 Other long term (current) drug therapy: Secondary | ICD-10-CM | POA: Diagnosis not present

## 2017-10-31 DIAGNOSIS — I10 Essential (primary) hypertension: Secondary | ICD-10-CM | POA: Insufficient documentation

## 2017-10-31 DIAGNOSIS — H269 Unspecified cataract: Secondary | ICD-10-CM | POA: Insufficient documentation

## 2017-11-05 DIAGNOSIS — H269 Unspecified cataract: Secondary | ICD-10-CM | POA: Diagnosis not present

## 2017-11-05 DIAGNOSIS — Z510 Encounter for antineoplastic radiation therapy: Secondary | ICD-10-CM | POA: Diagnosis not present

## 2017-11-05 DIAGNOSIS — I10 Essential (primary) hypertension: Secondary | ICD-10-CM | POA: Diagnosis not present

## 2017-11-05 DIAGNOSIS — Z85820 Personal history of malignant melanoma of skin: Secondary | ICD-10-CM | POA: Diagnosis not present

## 2017-11-05 DIAGNOSIS — C321 Malignant neoplasm of supraglottis: Secondary | ICD-10-CM | POA: Diagnosis not present

## 2017-11-05 DIAGNOSIS — Z79899 Other long term (current) drug therapy: Secondary | ICD-10-CM | POA: Diagnosis not present

## 2017-11-08 ENCOUNTER — Other Ambulatory Visit: Payer: Self-pay | Admitting: *Deleted

## 2017-11-08 DIAGNOSIS — C321 Malignant neoplasm of supraglottis: Secondary | ICD-10-CM

## 2017-11-11 ENCOUNTER — Ambulatory Visit
Admission: RE | Admit: 2017-11-11 | Discharge: 2017-11-11 | Disposition: A | Discharge status: Home / Self Care | Payer: Medicare Other | Source: Ambulatory Visit | Attending: Radiation Oncology | Admitting: Radiation Oncology

## 2017-11-12 ENCOUNTER — Ambulatory Visit
Admission: RE | Admit: 2017-11-12 | Discharge: 2017-11-12 | Disposition: A | Discharge status: Home / Self Care | Payer: Medicare Other | Source: Ambulatory Visit | Attending: Radiation Oncology | Admitting: Radiation Oncology

## 2017-11-12 DIAGNOSIS — Z510 Encounter for antineoplastic radiation therapy: Secondary | ICD-10-CM | POA: Diagnosis not present

## 2017-11-12 DIAGNOSIS — I10 Essential (primary) hypertension: Secondary | ICD-10-CM | POA: Diagnosis not present

## 2017-11-12 DIAGNOSIS — H269 Unspecified cataract: Secondary | ICD-10-CM | POA: Diagnosis not present

## 2017-11-12 DIAGNOSIS — C321 Malignant neoplasm of supraglottis: Secondary | ICD-10-CM | POA: Diagnosis not present

## 2017-11-12 DIAGNOSIS — Z85820 Personal history of malignant melanoma of skin: Secondary | ICD-10-CM | POA: Diagnosis not present

## 2017-11-12 DIAGNOSIS — Z79899 Other long term (current) drug therapy: Secondary | ICD-10-CM | POA: Diagnosis not present

## 2017-11-13 ENCOUNTER — Ambulatory Visit
Admission: RE | Admit: 2017-11-13 | Discharge: 2017-11-13 | Disposition: A | Discharge status: Home / Self Care | Payer: Medicare Other | Source: Ambulatory Visit | Attending: Radiation Oncology | Admitting: Radiation Oncology

## 2017-11-13 DIAGNOSIS — Z85820 Personal history of malignant melanoma of skin: Secondary | ICD-10-CM | POA: Diagnosis not present

## 2017-11-13 DIAGNOSIS — Z510 Encounter for antineoplastic radiation therapy: Secondary | ICD-10-CM | POA: Diagnosis not present

## 2017-11-13 DIAGNOSIS — I10 Essential (primary) hypertension: Secondary | ICD-10-CM | POA: Diagnosis not present

## 2017-11-13 DIAGNOSIS — Z79899 Other long term (current) drug therapy: Secondary | ICD-10-CM | POA: Diagnosis not present

## 2017-11-13 DIAGNOSIS — H269 Unspecified cataract: Secondary | ICD-10-CM | POA: Diagnosis not present

## 2017-11-13 DIAGNOSIS — C321 Malignant neoplasm of supraglottis: Secondary | ICD-10-CM | POA: Diagnosis not present

## 2017-11-14 ENCOUNTER — Ambulatory Visit
Admission: RE | Admit: 2017-11-14 | Discharge: 2017-11-14 | Disposition: A | Discharge status: Home / Self Care | Payer: Medicare Other | Source: Ambulatory Visit | Attending: Radiation Oncology | Admitting: Radiation Oncology

## 2017-11-14 DIAGNOSIS — H269 Unspecified cataract: Secondary | ICD-10-CM | POA: Diagnosis not present

## 2017-11-14 DIAGNOSIS — I10 Essential (primary) hypertension: Secondary | ICD-10-CM | POA: Diagnosis not present

## 2017-11-14 DIAGNOSIS — Z79899 Other long term (current) drug therapy: Secondary | ICD-10-CM | POA: Diagnosis not present

## 2017-11-14 DIAGNOSIS — Z510 Encounter for antineoplastic radiation therapy: Secondary | ICD-10-CM | POA: Diagnosis not present

## 2017-11-14 DIAGNOSIS — Z85820 Personal history of malignant melanoma of skin: Secondary | ICD-10-CM | POA: Diagnosis not present

## 2017-11-14 DIAGNOSIS — C321 Malignant neoplasm of supraglottis: Secondary | ICD-10-CM | POA: Diagnosis not present

## 2017-11-15 ENCOUNTER — Ambulatory Visit
Admission: RE | Admit: 2017-11-15 | Discharge: 2017-11-15 | Disposition: A | Discharge status: Home / Self Care | Payer: Medicare Other | Source: Ambulatory Visit | Attending: Radiation Oncology | Admitting: Radiation Oncology

## 2017-11-15 DIAGNOSIS — C321 Malignant neoplasm of supraglottis: Secondary | ICD-10-CM | POA: Diagnosis not present

## 2017-11-15 DIAGNOSIS — H269 Unspecified cataract: Secondary | ICD-10-CM | POA: Diagnosis not present

## 2017-11-15 DIAGNOSIS — Z510 Encounter for antineoplastic radiation therapy: Secondary | ICD-10-CM | POA: Diagnosis not present

## 2017-11-15 DIAGNOSIS — I10 Essential (primary) hypertension: Secondary | ICD-10-CM | POA: Diagnosis not present

## 2017-11-15 DIAGNOSIS — Z79899 Other long term (current) drug therapy: Secondary | ICD-10-CM | POA: Diagnosis not present

## 2017-11-15 DIAGNOSIS — Z85820 Personal history of malignant melanoma of skin: Secondary | ICD-10-CM | POA: Diagnosis not present

## 2017-11-18 ENCOUNTER — Ambulatory Visit
Admission: RE | Admit: 2017-11-18 | Discharge: 2017-11-18 | Disposition: A | Discharge status: Home / Self Care | Payer: Medicare Other | Source: Ambulatory Visit | Attending: Radiation Oncology | Admitting: Radiation Oncology

## 2017-11-18 DIAGNOSIS — C321 Malignant neoplasm of supraglottis: Secondary | ICD-10-CM | POA: Diagnosis not present

## 2017-11-18 DIAGNOSIS — Z510 Encounter for antineoplastic radiation therapy: Secondary | ICD-10-CM | POA: Diagnosis not present

## 2017-11-19 ENCOUNTER — Ambulatory Visit
Admission: RE | Admit: 2017-11-19 | Discharge: 2017-11-19 | Disposition: A | Discharge status: Home / Self Care | Payer: Medicare Other | Source: Ambulatory Visit | Attending: Radiation Oncology | Admitting: Radiation Oncology

## 2017-11-19 DIAGNOSIS — Z510 Encounter for antineoplastic radiation therapy: Secondary | ICD-10-CM | POA: Diagnosis not present

## 2017-11-19 DIAGNOSIS — C321 Malignant neoplasm of supraglottis: Secondary | ICD-10-CM | POA: Diagnosis not present

## 2017-11-20 ENCOUNTER — Ambulatory Visit
Admission: RE | Admit: 2017-11-20 | Discharge: 2017-11-20 | Disposition: A | Discharge status: Home / Self Care | Payer: Medicare Other | Source: Ambulatory Visit | Attending: Radiation Oncology | Admitting: Radiation Oncology

## 2017-11-20 DIAGNOSIS — C321 Malignant neoplasm of supraglottis: Secondary | ICD-10-CM | POA: Diagnosis not present

## 2017-11-20 DIAGNOSIS — Z510 Encounter for antineoplastic radiation therapy: Secondary | ICD-10-CM | POA: Diagnosis not present

## 2017-11-22 ENCOUNTER — Ambulatory Visit
Admission: RE | Admit: 2017-11-22 | Discharge: 2017-11-22 | Disposition: A | Discharge status: Home / Self Care | Payer: Medicare Other | Source: Ambulatory Visit | Attending: Radiation Oncology | Admitting: Radiation Oncology

## 2017-11-22 DIAGNOSIS — C321 Malignant neoplasm of supraglottis: Secondary | ICD-10-CM | POA: Diagnosis not present

## 2017-11-22 DIAGNOSIS — Z510 Encounter for antineoplastic radiation therapy: Secondary | ICD-10-CM | POA: Diagnosis not present

## 2017-11-25 ENCOUNTER — Inpatient Hospital Stay: Payer: Medicare Other | Attending: Oncology

## 2017-11-25 ENCOUNTER — Ambulatory Visit
Admission: RE | Admit: 2017-11-25 | Discharge: 2017-11-25 | Disposition: A | Discharge status: Home / Self Care | Payer: Medicare Other | Source: Ambulatory Visit | Attending: Radiation Oncology | Admitting: Radiation Oncology

## 2017-11-25 DIAGNOSIS — Z510 Encounter for antineoplastic radiation therapy: Secondary | ICD-10-CM | POA: Diagnosis not present

## 2017-11-25 DIAGNOSIS — C321 Malignant neoplasm of supraglottis: Secondary | ICD-10-CM

## 2017-11-25 LAB — CBC
HCT: 43.9 % (ref 40.0–52.0)
HEMOGLOBIN: 15.4 g/dL (ref 13.0–18.0)
MCH: 31.9 pg (ref 26.0–34.0)
MCHC: 35.1 g/dL (ref 32.0–36.0)
MCV: 90.8 fL (ref 80.0–100.0)
Platelets: 177 10*3/uL (ref 150–440)
RBC: 4.83 MIL/uL (ref 4.40–5.90)
RDW: 14.5 % (ref 11.5–14.5)
WBC: 9.3 10*3/uL (ref 3.8–10.6)

## 2017-11-26 ENCOUNTER — Ambulatory Visit
Admission: RE | Admit: 2017-11-26 | Discharge: 2017-11-26 | Disposition: A | Discharge status: Home / Self Care | Payer: Medicare Other | Source: Ambulatory Visit | Attending: Radiation Oncology | Admitting: Radiation Oncology

## 2017-11-26 DIAGNOSIS — Z510 Encounter for antineoplastic radiation therapy: Secondary | ICD-10-CM | POA: Diagnosis not present

## 2017-11-26 DIAGNOSIS — C321 Malignant neoplasm of supraglottis: Secondary | ICD-10-CM | POA: Diagnosis not present

## 2017-11-27 ENCOUNTER — Ambulatory Visit
Admission: RE | Admit: 2017-11-27 | Discharge: 2017-11-27 | Disposition: A | Discharge status: Home / Self Care | Payer: Medicare Other | Source: Ambulatory Visit | Attending: Radiation Oncology | Admitting: Radiation Oncology

## 2017-11-27 DIAGNOSIS — Z510 Encounter for antineoplastic radiation therapy: Secondary | ICD-10-CM | POA: Diagnosis not present

## 2017-11-27 DIAGNOSIS — C321 Malignant neoplasm of supraglottis: Secondary | ICD-10-CM | POA: Diagnosis not present

## 2017-11-28 ENCOUNTER — Ambulatory Visit
Admission: RE | Admit: 2017-11-28 | Discharge: 2017-11-28 | Disposition: A | Discharge status: Home / Self Care | Payer: Medicare Other | Source: Ambulatory Visit | Attending: Radiation Oncology | Admitting: Radiation Oncology

## 2017-11-28 DIAGNOSIS — Z510 Encounter for antineoplastic radiation therapy: Secondary | ICD-10-CM | POA: Diagnosis not present

## 2017-11-28 DIAGNOSIS — C321 Malignant neoplasm of supraglottis: Secondary | ICD-10-CM | POA: Diagnosis not present

## 2017-11-29 ENCOUNTER — Ambulatory Visit
Admission: RE | Admit: 2017-11-29 | Discharge: 2017-11-29 | Disposition: A | Discharge status: Home / Self Care | Payer: Medicare Other | Source: Ambulatory Visit | Attending: Radiation Oncology | Admitting: Radiation Oncology

## 2017-11-29 DIAGNOSIS — C321 Malignant neoplasm of supraglottis: Secondary | ICD-10-CM | POA: Diagnosis not present

## 2017-11-29 DIAGNOSIS — Z510 Encounter for antineoplastic radiation therapy: Secondary | ICD-10-CM | POA: Diagnosis not present

## 2017-12-02 ENCOUNTER — Inpatient Hospital Stay: Payer: Medicare Other

## 2017-12-02 ENCOUNTER — Ambulatory Visit
Admission: RE | Admit: 2017-12-02 | Discharge: 2017-12-02 | Disposition: A | Discharge status: Home / Self Care | Payer: Medicare Other | Source: Ambulatory Visit | Attending: Radiation Oncology | Admitting: Radiation Oncology

## 2017-12-02 DIAGNOSIS — Z510 Encounter for antineoplastic radiation therapy: Secondary | ICD-10-CM | POA: Diagnosis not present

## 2017-12-02 DIAGNOSIS — C321 Malignant neoplasm of supraglottis: Secondary | ICD-10-CM | POA: Diagnosis not present

## 2017-12-02 LAB — CBC
HEMATOCRIT: 43.6 % (ref 40.0–52.0)
HEMOGLOBIN: 15.1 g/dL (ref 13.0–18.0)
MCH: 31.6 pg (ref 26.0–34.0)
MCHC: 34.6 g/dL (ref 32.0–36.0)
MCV: 91.2 fL (ref 80.0–100.0)
Platelets: 176 10*3/uL (ref 150–440)
RBC: 4.78 MIL/uL (ref 4.40–5.90)
RDW: 14.3 % (ref 11.5–14.5)
WBC: 8.8 10*3/uL (ref 3.8–10.6)

## 2017-12-03 ENCOUNTER — Ambulatory Visit
Admission: RE | Admit: 2017-12-03 | Discharge: 2017-12-03 | Disposition: A | Discharge status: Home / Self Care | Payer: Medicare Other | Source: Ambulatory Visit | Attending: Radiation Oncology | Admitting: Radiation Oncology

## 2017-12-03 ENCOUNTER — Other Ambulatory Visit: Payer: Self-pay | Admitting: *Deleted

## 2017-12-03 DIAGNOSIS — Z510 Encounter for antineoplastic radiation therapy: Secondary | ICD-10-CM | POA: Diagnosis not present

## 2017-12-03 DIAGNOSIS — C321 Malignant neoplasm of supraglottis: Secondary | ICD-10-CM | POA: Diagnosis not present

## 2017-12-03 MED ORDER — FLUCONAZOLE 100 MG PO TABS: 100.0000 mg | ORAL_TABLET | Freq: Every day | ORAL | 0 refills | Status: DC

## 2017-12-04 ENCOUNTER — Ambulatory Visit
Admission: RE | Admit: 2017-12-04 | Discharge: 2017-12-04 | Disposition: A | Discharge status: Home / Self Care | Payer: Medicare Other | Source: Ambulatory Visit | Attending: Radiation Oncology | Admitting: Radiation Oncology

## 2017-12-04 DIAGNOSIS — C321 Malignant neoplasm of supraglottis: Secondary | ICD-10-CM | POA: Diagnosis not present

## 2017-12-04 DIAGNOSIS — Z510 Encounter for antineoplastic radiation therapy: Secondary | ICD-10-CM | POA: Diagnosis not present

## 2017-12-05 ENCOUNTER — Ambulatory Visit
Admission: RE | Admit: 2017-12-05 | Discharge: 2017-12-05 | Disposition: A | Discharge status: Home / Self Care | Payer: Medicare Other | Source: Ambulatory Visit | Attending: Radiation Oncology | Admitting: Radiation Oncology

## 2017-12-05 DIAGNOSIS — C321 Malignant neoplasm of supraglottis: Secondary | ICD-10-CM | POA: Diagnosis not present

## 2017-12-05 DIAGNOSIS — Z510 Encounter for antineoplastic radiation therapy: Secondary | ICD-10-CM | POA: Diagnosis not present

## 2017-12-06 ENCOUNTER — Ambulatory Visit
Admission: RE | Admit: 2017-12-06 | Discharge: 2017-12-06 | Disposition: A | Discharge status: Home / Self Care | Payer: Medicare Other | Source: Ambulatory Visit | Attending: Radiation Oncology | Admitting: Radiation Oncology

## 2017-12-06 DIAGNOSIS — C321 Malignant neoplasm of supraglottis: Secondary | ICD-10-CM | POA: Diagnosis not present

## 2017-12-06 DIAGNOSIS — Z510 Encounter for antineoplastic radiation therapy: Secondary | ICD-10-CM | POA: Diagnosis not present

## 2017-12-09 ENCOUNTER — Ambulatory Visit
Admission: RE | Admit: 2017-12-09 | Discharge: 2017-12-09 | Disposition: A | Discharge status: Home / Self Care | Payer: Medicare Other | Source: Ambulatory Visit | Attending: Radiation Oncology | Admitting: Radiation Oncology

## 2017-12-09 ENCOUNTER — Inpatient Hospital Stay: Payer: Medicare Other

## 2017-12-09 DIAGNOSIS — C321 Malignant neoplasm of supraglottis: Secondary | ICD-10-CM | POA: Diagnosis not present

## 2017-12-09 DIAGNOSIS — Z510 Encounter for antineoplastic radiation therapy: Secondary | ICD-10-CM | POA: Diagnosis not present

## 2017-12-09 LAB — CBC
HCT: 42.5 % (ref 40.0–52.0)
Hemoglobin: 14.6 g/dL (ref 13.0–18.0)
MCH: 31.4 pg (ref 26.0–34.0)
MCHC: 34.3 g/dL (ref 32.0–36.0)
MCV: 91.4 fL (ref 80.0–100.0)
Platelets: 172 10*3/uL (ref 150–440)
RBC: 4.65 MIL/uL (ref 4.40–5.90)
RDW: 14.6 % — AB (ref 11.5–14.5)
WBC: 8.2 10*3/uL (ref 3.8–10.6)

## 2017-12-10 ENCOUNTER — Ambulatory Visit
Admission: RE | Admit: 2017-12-10 | Discharge: 2017-12-10 | Disposition: A | Discharge status: Home / Self Care | Payer: Medicare Other | Source: Ambulatory Visit | Attending: Radiation Oncology | Admitting: Radiation Oncology

## 2017-12-10 DIAGNOSIS — C321 Malignant neoplasm of supraglottis: Secondary | ICD-10-CM | POA: Diagnosis not present

## 2017-12-10 DIAGNOSIS — Z510 Encounter for antineoplastic radiation therapy: Secondary | ICD-10-CM | POA: Diagnosis not present

## 2017-12-11 ENCOUNTER — Ambulatory Visit: Payer: Medicare Other

## 2017-12-12 ENCOUNTER — Ambulatory Visit: Payer: Medicare Other

## 2017-12-13 ENCOUNTER — Ambulatory Visit: Payer: Medicare Other

## 2017-12-16 ENCOUNTER — Inpatient Hospital Stay: Payer: Medicare Other

## 2017-12-16 ENCOUNTER — Ambulatory Visit
Admission: RE | Admit: 2017-12-16 | Discharge: 2017-12-16 | Disposition: A | Discharge status: Home / Self Care | Payer: Medicare Other | Source: Ambulatory Visit | Attending: Radiation Oncology | Admitting: Radiation Oncology

## 2017-12-16 DIAGNOSIS — C321 Malignant neoplasm of supraglottis: Secondary | ICD-10-CM

## 2017-12-16 DIAGNOSIS — Z510 Encounter for antineoplastic radiation therapy: Secondary | ICD-10-CM | POA: Diagnosis not present

## 2017-12-16 LAB — CBC
HCT: 40.4 % (ref 40.0–52.0)
Hemoglobin: 14 g/dL (ref 13.0–18.0)
MCH: 31.7 pg (ref 26.0–34.0)
MCHC: 34.8 g/dL (ref 32.0–36.0)
MCV: 91.1 fL (ref 80.0–100.0)
PLATELETS: 157 10*3/uL (ref 150–440)
RBC: 4.43 MIL/uL (ref 4.40–5.90)
RDW: 14.4 % (ref 11.5–14.5)
WBC: 8.1 10*3/uL (ref 3.8–10.6)

## 2017-12-17 ENCOUNTER — Ambulatory Visit
Admission: RE | Admit: 2017-12-17 | Discharge: 2017-12-17 | Disposition: A | Discharge status: Home / Self Care | Payer: Medicare Other | Source: Ambulatory Visit | Attending: Radiation Oncology | Admitting: Radiation Oncology

## 2017-12-17 DIAGNOSIS — C321 Malignant neoplasm of supraglottis: Secondary | ICD-10-CM | POA: Diagnosis not present

## 2017-12-17 DIAGNOSIS — Z510 Encounter for antineoplastic radiation therapy: Secondary | ICD-10-CM | POA: Diagnosis not present

## 2017-12-18 ENCOUNTER — Ambulatory Visit
Admission: RE | Admit: 2017-12-18 | Discharge: 2017-12-18 | Disposition: A | Discharge status: Home / Self Care | Payer: Medicare Other | Source: Ambulatory Visit | Attending: Radiation Oncology | Admitting: Radiation Oncology

## 2017-12-18 DIAGNOSIS — Z510 Encounter for antineoplastic radiation therapy: Secondary | ICD-10-CM | POA: Diagnosis not present

## 2017-12-18 DIAGNOSIS — C321 Malignant neoplasm of supraglottis: Secondary | ICD-10-CM | POA: Diagnosis not present

## 2017-12-19 ENCOUNTER — Ambulatory Visit
Admission: RE | Admit: 2017-12-19 | Discharge: 2017-12-19 | Disposition: A | Discharge status: Home / Self Care | Payer: Medicare Other | Source: Ambulatory Visit | Attending: Radiation Oncology | Admitting: Radiation Oncology

## 2017-12-19 DIAGNOSIS — C321 Malignant neoplasm of supraglottis: Secondary | ICD-10-CM | POA: Diagnosis not present

## 2017-12-19 DIAGNOSIS — Z510 Encounter for antineoplastic radiation therapy: Secondary | ICD-10-CM | POA: Diagnosis not present

## 2017-12-20 ENCOUNTER — Ambulatory Visit
Admission: RE | Admit: 2017-12-20 | Discharge: 2017-12-20 | Disposition: A | Discharge status: Home / Self Care | Payer: Medicare Other | Source: Ambulatory Visit | Attending: Radiation Oncology | Admitting: Radiation Oncology

## 2017-12-20 DIAGNOSIS — C321 Malignant neoplasm of supraglottis: Secondary | ICD-10-CM | POA: Diagnosis not present

## 2017-12-20 DIAGNOSIS — Z510 Encounter for antineoplastic radiation therapy: Secondary | ICD-10-CM | POA: Diagnosis not present

## 2017-12-23 ENCOUNTER — Ambulatory Visit
Admission: RE | Admit: 2017-12-23 | Discharge: 2017-12-23 | Disposition: A | Discharge status: Home / Self Care | Payer: Medicare Other | Source: Ambulatory Visit | Attending: Radiation Oncology | Admitting: Radiation Oncology

## 2017-12-23 ENCOUNTER — Inpatient Hospital Stay: Payer: Medicare Other | Attending: Oncology

## 2017-12-23 DIAGNOSIS — C321 Malignant neoplasm of supraglottis: Secondary | ICD-10-CM | POA: Diagnosis not present

## 2017-12-23 DIAGNOSIS — Z510 Encounter for antineoplastic radiation therapy: Secondary | ICD-10-CM | POA: Diagnosis not present

## 2017-12-23 LAB — CBC
HEMATOCRIT: 42.2 % (ref 40.0–52.0)
HEMOGLOBIN: 14.5 g/dL (ref 13.0–18.0)
MCH: 31.7 pg (ref 26.0–34.0)
MCHC: 34.3 g/dL (ref 32.0–36.0)
MCV: 92.3 fL (ref 80.0–100.0)
Platelets: 165 10*3/uL (ref 150–440)
RBC: 4.58 MIL/uL (ref 4.40–5.90)
RDW: 14.7 % — ABNORMAL HIGH (ref 11.5–14.5)
WBC: 8.6 10*3/uL (ref 3.8–10.6)

## 2017-12-24 ENCOUNTER — Ambulatory Visit
Admission: RE | Admit: 2017-12-24 | Discharge: 2017-12-24 | Disposition: A | Discharge status: Home / Self Care | Payer: Medicare Other | Source: Ambulatory Visit | Attending: Radiation Oncology | Admitting: Radiation Oncology

## 2017-12-24 DIAGNOSIS — C321 Malignant neoplasm of supraglottis: Secondary | ICD-10-CM | POA: Diagnosis not present

## 2017-12-24 DIAGNOSIS — Z510 Encounter for antineoplastic radiation therapy: Secondary | ICD-10-CM | POA: Diagnosis not present

## 2017-12-25 ENCOUNTER — Ambulatory Visit
Admission: RE | Admit: 2017-12-25 | Discharge: 2017-12-25 | Disposition: A | Discharge status: Home / Self Care | Payer: Medicare Other | Source: Ambulatory Visit | Attending: Radiation Oncology | Admitting: Radiation Oncology

## 2017-12-25 DIAGNOSIS — C321 Malignant neoplasm of supraglottis: Secondary | ICD-10-CM | POA: Diagnosis not present

## 2017-12-25 DIAGNOSIS — Z510 Encounter for antineoplastic radiation therapy: Secondary | ICD-10-CM | POA: Diagnosis not present

## 2017-12-26 ENCOUNTER — Ambulatory Visit
Admission: RE | Admit: 2017-12-26 | Discharge: 2017-12-26 | Disposition: A | Discharge status: Home / Self Care | Payer: Medicare Other | Source: Ambulatory Visit | Attending: Radiation Oncology | Admitting: Radiation Oncology

## 2017-12-26 DIAGNOSIS — C321 Malignant neoplasm of supraglottis: Secondary | ICD-10-CM | POA: Diagnosis not present

## 2017-12-26 DIAGNOSIS — Z510 Encounter for antineoplastic radiation therapy: Secondary | ICD-10-CM | POA: Diagnosis not present

## 2017-12-27 ENCOUNTER — Ambulatory Visit
Admission: RE | Admit: 2017-12-27 | Discharge: 2017-12-27 | Disposition: A | Discharge status: Home / Self Care | Payer: Medicare Other | Source: Ambulatory Visit | Attending: Radiation Oncology | Admitting: Radiation Oncology

## 2017-12-27 ENCOUNTER — Ambulatory Visit: Payer: Medicare Other

## 2017-12-27 DIAGNOSIS — C321 Malignant neoplasm of supraglottis: Secondary | ICD-10-CM | POA: Diagnosis not present

## 2017-12-27 DIAGNOSIS — Z510 Encounter for antineoplastic radiation therapy: Secondary | ICD-10-CM | POA: Diagnosis not present

## 2017-12-30 ENCOUNTER — Ambulatory Visit
Admission: RE | Admit: 2017-12-30 | Discharge: 2017-12-30 | Disposition: A | Discharge status: Home / Self Care | Payer: Medicare Other | Source: Ambulatory Visit | Attending: Radiation Oncology | Admitting: Radiation Oncology

## 2017-12-30 DIAGNOSIS — Z510 Encounter for antineoplastic radiation therapy: Secondary | ICD-10-CM | POA: Diagnosis not present

## 2017-12-30 DIAGNOSIS — C321 Malignant neoplasm of supraglottis: Secondary | ICD-10-CM | POA: Diagnosis not present

## 2017-12-31 ENCOUNTER — Ambulatory Visit
Admission: RE | Admit: 2017-12-31 | Discharge: 2017-12-31 | Disposition: A | Discharge status: Home / Self Care | Payer: Medicare Other | Source: Ambulatory Visit | Attending: Radiation Oncology | Admitting: Radiation Oncology

## 2017-12-31 DIAGNOSIS — Z510 Encounter for antineoplastic radiation therapy: Secondary | ICD-10-CM | POA: Diagnosis not present

## 2017-12-31 DIAGNOSIS — C321 Malignant neoplasm of supraglottis: Secondary | ICD-10-CM | POA: Diagnosis not present

## 2018-01-01 ENCOUNTER — Ambulatory Visit
Admission: RE | Admit: 2018-01-01 | Discharge: 2018-01-01 | Disposition: A | Discharge status: Home / Self Care | Payer: Medicare Other | Source: Ambulatory Visit | Attending: Radiation Oncology | Admitting: Radiation Oncology

## 2018-01-01 DIAGNOSIS — Z510 Encounter for antineoplastic radiation therapy: Secondary | ICD-10-CM | POA: Diagnosis not present

## 2018-01-01 DIAGNOSIS — C321 Malignant neoplasm of supraglottis: Secondary | ICD-10-CM | POA: Diagnosis not present

## 2018-02-03 ENCOUNTER — Other Ambulatory Visit: Payer: Self-pay | Admitting: *Deleted

## 2018-02-03 ENCOUNTER — Other Ambulatory Visit: Payer: Self-pay

## 2018-02-03 ENCOUNTER — Ambulatory Visit
Admission: RE | Admit: 2018-02-03 | Discharge: 2018-02-03 | Disposition: A | Discharge status: Home / Self Care | Payer: Medicare Other | Source: Ambulatory Visit | Attending: Radiation Oncology | Admitting: Radiation Oncology

## 2018-02-03 ENCOUNTER — Encounter: Payer: Self-pay | Admitting: Radiation Oncology

## 2018-02-03 DIAGNOSIS — Z923 Personal history of irradiation: Secondary | ICD-10-CM | POA: Diagnosis not present

## 2018-02-03 DIAGNOSIS — C321 Malignant neoplasm of supraglottis: Secondary | ICD-10-CM

## 2018-02-03 NOTE — Progress Notes (Signed)
Radiation Oncology Follow up Note  Name: Bradley Edwards   Date:   02/03/2018 MRN:  [id number] DOB: 07/31/1950    This 67 y.o. male presents to the clinic today for one-month follow-up status post radiation therapy for stage I epiglottic squamous cell carcinoma.  REFERRING PROVIDER: Jennette Kettle, MD  HPI: patient is a 67 year old male now one month out having completed radiation therapy external beam for a stage I (T1 N0 M0) squamous cell carcinoma the epiglottis papillary type. Seen today in routine follow-up he is really doing well he specifically denies dysphagia head and neck pain. His taste is returning and he specifically denies xerostomia..  COMPLICATIONS OF TREATMENT: none  FOLLOW UP COMPLIANCE: keeps appointments   PHYSICAL EXAM:  BP (!) (P) 148/73 (BP Location: Left Arm, Patient Position: Sitting)   Pulse (!) (P) 41   Temp (P) 97.6 F (36.4 C) (Tympanic)   Resp (P) 16   Wt (P) 149 lb 14.6 oz (68 kg)   BMI (P) 24.20 kg/m  Oral cavity is clear no oral mucosal lesions are identified tonsillar region looks within normal limits indirect mirror examination shows upper airway clear vallecula and base of tongue within normal limits neck is clear without evidence of subject gastric cervical or supraclavicular adenopathy.Well-developed well-nourished patient in NAD. HEENT reveals PERLA, EOMI, discs not visualized.  Oral cavity is clear. No oral mucosal lesions are identified. Neck is clear without evidence of cervical or supraclavicular adenopathy. Lungs are clear to A&P. Cardiac examination is essentially unremarkable with regular rate and rhythm without murmur rub or thrill. Abdomen is benign with no organomegaly or masses noted. Motor sensory and DTR levels are equal and symmetric in the upper and lower extremities. Cranial nerves II through XII are grossly intact. Proprioception is intact. No peripheral adenopathy or edema is identified. No motor or sensory levels are noted. Crude  visual fields are within normal range.  RADIOLOGY RESULTS: no current films reviewed CT scan ordered for 3 months  PLAN: present time patient is doing well recovering nicely from his radiation therapy treatments I'm please was overall progress. I've asked him to make a follow-up appointment with Dr. Tollie Pizza for close follow-up care I have asked to see him back in 3 or 4 months with a CT scan of the head and neck region prior to that visit. Patient is to call with any concerns.  I would like to take this opportunity to thank you for allowing me to participate in the care of your patient.Noreene Filbert, MD

## 2018-02-19 DIAGNOSIS — Z8521 Personal history of malignant neoplasm of larynx: Secondary | ICD-10-CM | POA: Diagnosis not present

## 2018-02-19 DIAGNOSIS — R070 Pain in throat: Secondary | ICD-10-CM | POA: Diagnosis not present

## 2018-03-05 DIAGNOSIS — C44519 Basal cell carcinoma of skin of other part of trunk: Secondary | ICD-10-CM | POA: Diagnosis not present

## 2018-03-05 DIAGNOSIS — Z85820 Personal history of malignant melanoma of skin: Secondary | ICD-10-CM | POA: Diagnosis not present

## 2018-03-05 DIAGNOSIS — L578 Other skin changes due to chronic exposure to nonionizing radiation: Secondary | ICD-10-CM | POA: Diagnosis not present

## 2018-03-05 DIAGNOSIS — L570 Actinic keratosis: Secondary | ICD-10-CM | POA: Diagnosis not present

## 2018-03-05 DIAGNOSIS — L820 Inflamed seborrheic keratosis: Secondary | ICD-10-CM | POA: Diagnosis not present

## 2018-03-05 DIAGNOSIS — Z1283 Encounter for screening for malignant neoplasm of skin: Secondary | ICD-10-CM | POA: Diagnosis not present

## 2018-03-05 DIAGNOSIS — D485 Neoplasm of uncertain behavior of skin: Secondary | ICD-10-CM | POA: Diagnosis not present

## 2018-04-02 DIAGNOSIS — R070 Pain in throat: Secondary | ICD-10-CM | POA: Diagnosis not present

## 2018-04-02 DIAGNOSIS — Z8521 Personal history of malignant neoplasm of larynx: Secondary | ICD-10-CM | POA: Diagnosis not present

## 2018-05-05 DIAGNOSIS — L578 Other skin changes due to chronic exposure to nonionizing radiation: Secondary | ICD-10-CM | POA: Diagnosis not present

## 2018-05-05 DIAGNOSIS — C44519 Basal cell carcinoma of skin of other part of trunk: Secondary | ICD-10-CM | POA: Diagnosis not present

## 2018-05-30 ENCOUNTER — Ambulatory Visit
Admission: RE | Admit: 2018-05-30 | Discharge: 2018-05-30 | Disposition: A | Discharge status: Home / Self Care | Payer: Medicare Other | Source: Ambulatory Visit | Attending: Radiation Oncology | Admitting: Radiation Oncology

## 2018-05-30 DIAGNOSIS — C321 Malignant neoplasm of supraglottis: Secondary | ICD-10-CM | POA: Diagnosis not present

## 2018-05-30 DIAGNOSIS — Z8521 Personal history of malignant neoplasm of larynx: Secondary | ICD-10-CM | POA: Diagnosis not present

## 2018-05-30 LAB — POCT I-STAT CREATININE: Creatinine, Ser: 1.1 mg/dL (ref 0.61–1.24)

## 2018-05-30 MED ORDER — IOPAMIDOL (ISOVUE-300) INJECTION 61%
75.0000 mL | Freq: Once | INTRAVENOUS | Status: AC | PRN
  Administered 2018-05-30: 75 mL via INTRAVENOUS

## 2018-06-05 ENCOUNTER — Other Ambulatory Visit: Payer: Self-pay

## 2018-06-05 ENCOUNTER — Ambulatory Visit
Admission: RE | Admit: 2018-06-05 | Discharge: 2018-06-05 | Disposition: A | Discharge status: Home / Self Care | Payer: Medicare Other | Source: Ambulatory Visit | Attending: Radiation Oncology | Admitting: Radiation Oncology

## 2018-06-05 ENCOUNTER — Encounter: Payer: Self-pay | Admitting: Radiation Oncology

## 2018-06-05 VITALS — BP 122/58 | HR 63 | Temp 97.8°F | Resp 20 | Wt 144.7 lb

## 2018-06-05 DIAGNOSIS — F17210 Nicotine dependence, cigarettes, uncomplicated: Secondary | ICD-10-CM | POA: Insufficient documentation

## 2018-06-05 DIAGNOSIS — C321 Malignant neoplasm of supraglottis: Secondary | ICD-10-CM | POA: Insufficient documentation

## 2018-06-05 DIAGNOSIS — Z923 Personal history of irradiation: Secondary | ICD-10-CM | POA: Diagnosis not present

## 2018-06-05 NOTE — Progress Notes (Signed)
Radiation Oncology Follow up Note  Name: Bradley Edwards   Date:   06/05/2018 MRN:  [id number] DOB: [birth year]    This 68 y.o. male presents to the clinic today for six-month follow-up status post revision therapy for stage I epiglottic squamous cell carcinoma.  REFERRING PROVIDER: Jennette Kettle, MD  HPI: patient is a 68 year old male now out 6 months having completed radiation therapy for stage I squamous cell carcinoma the epiglottis. He is seen today in routine follow up is doing well specifically denies head and neck pain or dysphagia. Still has some alteration of taste although that is improving. Recently had a CT scan.showing no evidence of recurrent disease.  COMPLICATIONS OF TREATMENT: none  FOLLOW UP COMPLIANCE: keeps appointments   PHYSICAL EXAM:  BP (!) 122/58 (BP Location: Left Arm, Patient Position: Sitting)   Pulse 63   Temp 97.8 F (36.6 C) (Tympanic)   Resp 20   Wt 144 lb 11.7 oz (65.6 kg)   BMI 23.36 kg/m  Oral cavity is clear no oral mucosal lesions are identified indirect mirror examination shows upper airway clear vallecula and base of tongue within normal limits. Cords are mobile no evidence disease in the larynx is noted. Neck is clear without evidence of cervical or supraclavicular adenopathy. Macro 1  RADIOLOGY RESULTS: CT scan is reviewed and compatible with the above-stated findings  PLAN: the present time he is now 70 S. With no evidence of disease. I'm please was overall progress. I've asked to see him back in 6 months for follow-up. I will then go to once your follow-up appointments. Patient is to call sooner with any concerns. I would like to take this opportunity to thank you for allowing me to participate in the care of your patient.Noreene Filbert, MD

## 2018-07-03 DIAGNOSIS — Z8521 Personal history of malignant neoplasm of larynx: Secondary | ICD-10-CM | POA: Diagnosis not present

## 2018-07-03 DIAGNOSIS — R070 Pain in throat: Secondary | ICD-10-CM | POA: Diagnosis not present

## 2018-08-01 IMAGING — PT NM PET TUM IMG INITIAL (PI) SKULL BASE T - THIGH
1 of 9 series · 1 of 25 positions shown · non-contrast
Comparison: None.

CLINICAL DATA: Initial treatment strategy for malignant neoplasm of
the head and neck. Papillary squamous carcinoma of the epiglottis
diagnosed 10/08/2017. History of right neck melanoma.

EXAM:
NUCLEAR MEDICINE PET SKULL BASE TO THIGH
TECHNIQUE: 9.03 mCi F-18 FDG was injected intravenously. Full-ring PET imaging
was performed from the skull base to thigh after the radiotracer. CT
data was obtained and used for attenuation correction and anatomic
localization.
Fasting blood glucose: 102 mg/dl

[Series 3: ct wb 5.0 b30f · axial · 5.0mm · 0.98mm/px · 1 of 329 slices shown]
[im 329/329  brain]
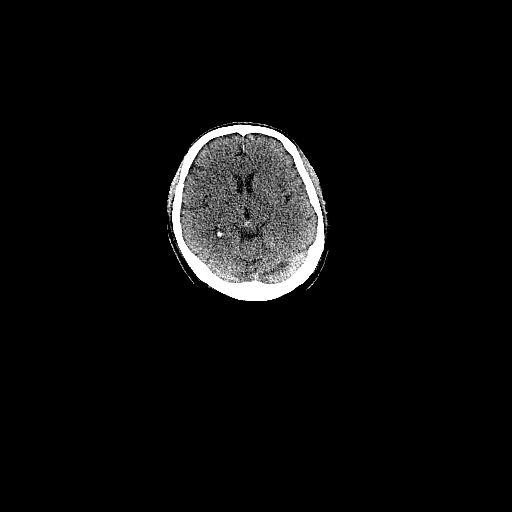

[1 of 25 positions shown; findings below may reference images not displayed]

FINDINGS: Mediastinal blood pool activity: SUV max

NECK:

No hypermetabolic cervical lymph nodes are identified.There are no
lesions of the pharyngeal mucosal space. Specifically, no abnormal
activity within the epiglottis.

Incidental CT findings: Bilateral carotid atherosclerosis.

CHEST:

There are no hypermetabolic mediastinal, hilar or axillary lymph
nodes. No abnormal pulmonary activity.

Incidental CT findings: Atherosclerosis of aorta, great vessels and
coronary arteries. Moderate upper lobe predominant paraseptal and
centrilobular emphysema. Small hiatal hernia.

ABDOMEN/PELVIS:

There is no hypermetabolic activity within the liver, adrenal
glands, spleen or pancreas. There is no hypermetabolic nodal
activity.

Incidental CT findings: Aortic and side-branch atherosclerosis.
Low-density renal lesions bilaterally without metabolic activity
consistent with cysts. There is mild bladder wall thickening and
mild enlargement of the prostate gland.

SKELETON:

There is no hypermetabolic activity to suggest osseous metastatic
disease.

Incidental CT findings: Mild degenerative changes in the spine.
IMPRESSION: 1. No hypermetabolism is seen within the epiglottis or remainder of
the pharyngeal mucosal space.
2. No evidence of metastatic disease.
3. Aortic Atherosclerosis (DC40C-JBX.X) and Emphysema (DC40C-Q5V.J).

## 2018-10-01 DIAGNOSIS — Z8521 Personal history of malignant neoplasm of larynx: Secondary | ICD-10-CM | POA: Diagnosis not present

## 2018-10-31 ENCOUNTER — Encounter: Payer: Self-pay | Admitting: *Deleted

## 2018-11-06 DIAGNOSIS — C44619 Basal cell carcinoma of skin of left upper limb, including shoulder: Secondary | ICD-10-CM | POA: Diagnosis not present

## 2018-11-06 DIAGNOSIS — L578 Other skin changes due to chronic exposure to nonionizing radiation: Secondary | ICD-10-CM | POA: Diagnosis not present

## 2018-11-06 DIAGNOSIS — Z85828 Personal history of other malignant neoplasm of skin: Secondary | ICD-10-CM | POA: Diagnosis not present

## 2018-11-06 DIAGNOSIS — D485 Neoplasm of uncertain behavior of skin: Secondary | ICD-10-CM | POA: Diagnosis not present

## 2018-11-06 DIAGNOSIS — L570 Actinic keratosis: Secondary | ICD-10-CM | POA: Diagnosis not present

## 2018-11-06 DIAGNOSIS — R234 Changes in skin texture: Secondary | ICD-10-CM | POA: Diagnosis not present

## 2018-12-11 ENCOUNTER — Ambulatory Visit: Payer: Medicare Other | Admitting: Radiation Oncology

## 2018-12-11 ENCOUNTER — Other Ambulatory Visit: Payer: Self-pay

## 2018-12-11 ENCOUNTER — Encounter: Payer: Self-pay | Admitting: Radiation Oncology

## 2018-12-11 ENCOUNTER — Ambulatory Visit
Admission: RE | Admit: 2018-12-11 | Discharge: 2018-12-11 | Disposition: A | Discharge status: Home / Self Care | Payer: Medicare Other | Source: Ambulatory Visit | Attending: Radiation Oncology | Admitting: Radiation Oncology

## 2018-12-11 VITALS — BP 130/62 | HR 55 | Temp 97.6°F | Resp 18 | Wt 144.3 lb

## 2018-12-11 DIAGNOSIS — Z8521 Personal history of malignant neoplasm of larynx: Secondary | ICD-10-CM | POA: Insufficient documentation

## 2018-12-11 DIAGNOSIS — C321 Malignant neoplasm of supraglottis: Secondary | ICD-10-CM

## 2018-12-11 DIAGNOSIS — Z923 Personal history of irradiation: Secondary | ICD-10-CM | POA: Insufficient documentation

## 2018-12-11 NOTE — Progress Notes (Signed)
Radiation Oncology Follow up Note  Name: Bradley Edwards   Date:   12/11/2018 MRN:  [id number] DOB: May 25, 1950    This 68 y.o. male presents to the clinic today for 35-month follow-up status post radiation therapy for stage I epiglottic squamous cell carcinoma.  REFERRING PROVIDER: Jennette Kettle, MD  HPI: Patient is a 68 year old male now at 11 months having completed 6 external beam radiation therapy for squamous cell carcinoma of the epiglottis stage I.  Seen today in routine follow-up he is doing well.  He specifically denies cough dysphasia or head and neck pain still has some slight alteration of taste.  He is under 15-month surveillance by ENT all his upper endoscopies have been within within normal limits without evidence of disease.  CT scan of his head and neck back in January showed no evidence of disease.  COMPLICATIONS OF TREATMENT: none  FOLLOW UP COMPLIANCE: keeps appointments   PHYSICAL EXAM:  BP 130/62   Pulse (!) 55   Temp 97.6 F (36.4 C)   Resp 18   Wt 144 lb 4.7 oz (65.5 kg)   BMI 23.29 kg/m  Well-developed well-nourished patient in NAD. HEENT reveals PERLA, EOMI, discs not visualized.  Oral cavity is clear. No oral mucosal lesions are identified. Neck is clear without evidence of cervical or supraclavicular adenopathy. Lungs are clear to A&P. Cardiac examination is essentially unremarkable with regular rate and rhythm without murmur rub or thrill. Abdomen is benign with no organomegaly or masses noted. Motor sensory and DTR levels are equal and symmetric in the upper and lower extremities. Cranial nerves II through XII are grossly intact. Proprioception is intact. No peripheral adenopathy or edema is identified. No motor or sensory levels are noted. Crude visual fields are within normal range.  RADIOLOGY RESULTS: CT scan of head and neck from January reviewed compatible with above-stated findings  PLAN: Present time patient is doing well with no evidence of disease  both by endoscopy and CT criteria.  I am pleased with his overall progress.  I have asked to see him back in 1 year for follow-up.  Patient knows to call with any concerns.  I would like to take this opportunity to thank you for allowing me to participate in the care of your patient.Noreene Filbert, MD

## 2019-01-01 DIAGNOSIS — C329 Malignant neoplasm of larynx, unspecified: Secondary | ICD-10-CM | POA: Diagnosis not present

## 2019-01-15 ENCOUNTER — Other Ambulatory Visit: Payer: Self-pay

## 2019-01-15 ENCOUNTER — Encounter
Admission: RE | Admit: 2019-01-15 | Discharge: 2019-01-15 | Disposition: A | Discharge status: Home / Self Care | Payer: Medicare Other | Source: Ambulatory Visit | Attending: Otolaryngology | Admitting: Otolaryngology

## 2019-01-15 DIAGNOSIS — Z01818 Encounter for other preprocedural examination: Secondary | ICD-10-CM | POA: Diagnosis not present

## 2019-01-15 DIAGNOSIS — Z20828 Contact with and (suspected) exposure to other viral communicable diseases: Secondary | ICD-10-CM | POA: Insufficient documentation

## 2019-01-15 NOTE — Patient Instructions (Signed)
Your procedure is scheduled on: Wednesday 01/21/19 Report to [facility]. To find out your arrival time please call [phone] between 1PM - 3PM on Tuesday 01/20/19.  Remember: Instructions that are not followed completely may result in serious medical risk, up to and including death, or upon the discretion of your surgeon and anesthesiologist your surgery may need to be rescheduled.     _X__ 1. Do not eat food after midnight the night before your procedure.                 No gum chewing or hard candies. You may drink clear liquids up to 2 hours                 before you are scheduled to arrive for your surgery- DO not drink clear                 liquids within 2 hours of the start of your surgery.                 Clear Liquids include:  water, apple juice without pulp, clear carbohydrate                 drink such as Clearfast or Gatorade, Black Coffee or Tea (Do not add                 anything to coffee or tea). Diabetics water only  __X__2.  On the morning of surgery brush your teeth with toothpaste and water, you                 may rinse your mouth with mouthwash if you wish.  Do not swallow any              toothpaste of mouthwash.     _X__ 3.  No Alcohol for 24 hours before or after surgery.   _X__ 4.  Do Not Smoke or use e-cigarettes For 24 Hours Prior to Your Surgery.                 Do not use any chewable tobacco products for at least 6 hours prior to                 surgery.  ____  5.  Bring all medications with you on the day of surgery if instructed.   __X__  6.  Notify your doctor if there is any change in your medical condition      (cold, fever, infections).     Do not wear jewelry, make-up, hairpins, clips or nail polish. Do not wear lotions, powders, or perfumes.  Do not shave 48 hours prior to surgery. Men may shave face and neck. Do not bring valuables to the hospital.    [facility] is not responsible for  any belongings or valuables.  Contacts, dentures/partials or body piercings may not be worn into surgery. Bring a case for your contacts, glasses or hearing aids, a denture cup will be supplied. Leave your suitcase in the car. After surgery it may be brought to your room. For patients admitted to the hospital, discharge time is determined by your treatment team.   Patients discharged the day of surgery will not be allowed to drive home.   Please read over the following fact sheets that you were given:   MRSA Information  __X__ Take these medicines the morning of surgery with A SIP OF WATER:  1. amLODipine (NORVASC  2.   3.   4.  5.  6.  ____ Fleet Enema (as directed)   __ __ Use CHG Soap/SAGE wipes as directed  ____ Use inhalers on the day of surgery  ____ Stop metformin/Janumet/Farxiga 2 days prior to surgery    ____ Take 1/2 of usual insulin dose the night before surgery. No insulin the morning          of surgery.   ____ Stop Blood Thinners Coumadin/Plavix/Xarelto/Pleta/Pradaxa/Eliquis/Effient/Aspirin  on   Or contact your Surgeon, Cardiologist or Medical Doctor regarding  ability to stop your blood thinners  __X__ Stop Anti-inflammatories 7 days before surgery such as Advil, Ibuprofen, Motrin,  BC or Goodies Powder, Naprosyn, Naproxen, Aleve, Aspirin    __X__ Stop all herbal supplements, fish oil or vitamin E until after surgery.    ____ Bring C-Pap to the hospital.

## 2019-01-15 NOTE — Pre-Procedure Instructions (Signed)
REviewed EKG with DR Ola Spurr. No new orders. Copy of stress test requested from [facility], last cardio note on chart.

## 2019-01-16 ENCOUNTER — Other Ambulatory Visit
Admission: RE | Admit: 2019-01-16 | Discharge: 2019-01-16 | Disposition: A | Discharge status: Home / Self Care | Payer: Medicare Other | Source: Ambulatory Visit | Attending: Otolaryngology | Admitting: Otolaryngology

## 2019-01-16 ENCOUNTER — Other Ambulatory Visit: Payer: Self-pay

## 2019-01-16 DIAGNOSIS — Z01818 Encounter for other preprocedural examination: Secondary | ICD-10-CM | POA: Diagnosis not present

## 2019-01-17 LAB — SARS CORONAVIRUS 2 (TAT 6-24 HRS): SARS Coronavirus 2: NEGATIVE

## 2019-01-21 ENCOUNTER — Ambulatory Visit: Payer: Medicare Other | Admitting: Anesthesiology

## 2019-01-21 ENCOUNTER — Ambulatory Visit
Admission: RE | Admit: 2019-01-21 | Discharge: 2019-01-21 | Disposition: A | Discharge status: Home / Self Care | Payer: Medicare Other | Attending: Otolaryngology | Admitting: Otolaryngology

## 2019-01-21 ENCOUNTER — Other Ambulatory Visit: Payer: Self-pay

## 2019-01-21 ENCOUNTER — Encounter
Admission: RE | Disposition: A | Discharge status: Home / Self Care | Payer: Self-pay | Source: Home / Self Care | Attending: Otolaryngology

## 2019-01-21 ENCOUNTER — Encounter: Payer: Self-pay | Admitting: *Deleted

## 2019-01-21 DIAGNOSIS — Z9842 Cataract extraction status, left eye: Secondary | ICD-10-CM | POA: Diagnosis not present

## 2019-01-21 DIAGNOSIS — Z9841 Cataract extraction status, right eye: Secondary | ICD-10-CM | POA: Diagnosis not present

## 2019-01-21 DIAGNOSIS — Z85820 Personal history of malignant melanoma of skin: Secondary | ICD-10-CM | POA: Insufficient documentation

## 2019-01-21 DIAGNOSIS — C321 Malignant neoplasm of supraglottis: Secondary | ICD-10-CM | POA: Insufficient documentation

## 2019-01-21 DIAGNOSIS — I10 Essential (primary) hypertension: Secondary | ICD-10-CM | POA: Insufficient documentation

## 2019-01-21 DIAGNOSIS — F17200 Nicotine dependence, unspecified, uncomplicated: Secondary | ICD-10-CM | POA: Diagnosis not present

## 2019-01-21 DIAGNOSIS — J449 Chronic obstructive pulmonary disease, unspecified: Secondary | ICD-10-CM | POA: Diagnosis not present

## 2019-01-21 DIAGNOSIS — C329 Malignant neoplasm of larynx, unspecified: Secondary | ICD-10-CM | POA: Diagnosis not present

## 2019-01-21 DIAGNOSIS — Z79899 Other long term (current) drug therapy: Secondary | ICD-10-CM | POA: Diagnosis not present

## 2019-01-21 HISTORY — PX: MICROLARYNGOSCOPY: SHX5208

## 2019-01-21 SURGERY — MICROLARYNGOSCOPY
Anesthesia: General

## 2019-01-21 MED ORDER — DEXAMETHASONE SODIUM PHOSPHATE 10 MG/ML IJ SOLN
INTRAMUSCULAR | Status: DC | PRN
  Administered 2019-01-21: 10 mg via INTRAVENOUS

## 2019-01-21 MED ORDER — FENTANYL CITRATE (PF) 100 MCG/2ML IJ SOLN: 25.0000 ug | INTRAMUSCULAR | Status: DC | PRN

## 2019-01-21 MED ORDER — LACTATED RINGERS IV SOLN
INTRAVENOUS | Status: DC
  Administered 2019-01-21: 08:00:00 via INTRAVENOUS

## 2019-01-21 MED ORDER — FENTANYL CITRATE (PF) 100 MCG/2ML IJ SOLN
INTRAMUSCULAR | Status: AC
  Filled 2019-01-21: qty 2

## 2019-01-21 MED ORDER — PROPOFOL 10 MG/ML IV BOLUS
INTRAVENOUS | Status: DC | PRN
  Administered 2019-01-21: 120 mg via INTRAVENOUS

## 2019-01-21 MED ORDER — OXYCODONE HCL 5 MG PO TABS: 5.0000 mg | ORAL_TABLET | Freq: Once | ORAL | Status: DC | PRN

## 2019-01-21 MED ORDER — LIDOCAINE-EPINEPHRINE (PF) 1 %-1:200000 IJ SOLN
INTRAMUSCULAR | Status: AC
  Filled 2019-01-21: qty 30

## 2019-01-21 MED ORDER — EPHEDRINE SULFATE 50 MG/ML IJ SOLN
INTRAMUSCULAR | Status: DC | PRN
  Administered 2019-01-21 (×2): 10 mg via INTRAVENOUS

## 2019-01-21 MED ORDER — PROPOFOL 10 MG/ML IV BOLUS
INTRAVENOUS | Status: AC
  Filled 2019-01-21: qty 20

## 2019-01-21 MED ORDER — PHENYLEPHRINE HCL (PRESSORS) 10 MG/ML IV SOLN
INTRAVENOUS | Status: DC | PRN
  Administered 2019-01-21: 100 ug via INTRAVENOUS

## 2019-01-21 MED ORDER — LIDOCAINE HCL (CARDIAC) PF 100 MG/5ML IV SOSY
PREFILLED_SYRINGE | INTRAVENOUS | Status: DC | PRN
  Administered 2019-01-21: 80 mg via INTRAVENOUS

## 2019-01-21 MED ORDER — HYDROCODONE-ACETAMINOPHEN 5-325 MG PO TABS: 1.0000 | ORAL_TABLET | Freq: Four times a day (QID) | ORAL | 0 refills | Status: DC | PRN

## 2019-01-21 MED ORDER — OXYCODONE HCL 5 MG/5ML PO SOLN: 5.0000 mg | Freq: Once | ORAL | Status: DC | PRN

## 2019-01-21 MED ORDER — OXYMETAZOLINE HCL 0.05 % NA SOLN
NASAL | Status: AC
  Filled 2019-01-21: qty 30

## 2019-01-21 MED ORDER — SUCCINYLCHOLINE CHLORIDE 20 MG/ML IJ SOLN
INTRAMUSCULAR | Status: DC | PRN
  Administered 2019-01-21: 120 mg via INTRAVENOUS

## 2019-01-21 MED ORDER — METHYLENE BLUE 0.5 % INJ SOLN
INTRAVENOUS | Status: AC
  Filled 2019-01-21: qty 10

## 2019-01-21 MED ORDER — OXYMETAZOLINE HCL 0.05 % NA SOLN: NASAL | Status: DC | PRN

## 2019-01-21 MED ORDER — FENTANYL CITRATE (PF) 100 MCG/2ML IJ SOLN
INTRAMUSCULAR | Status: DC | PRN
  Administered 2019-01-21 (×2): 50 ug via INTRAVENOUS

## 2019-01-21 MED ORDER — FAMOTIDINE 20 MG PO TABS
20.0000 mg | ORAL_TABLET | Freq: Once | ORAL | Status: AC
  Administered 2019-01-21: 20 mg via ORAL

## 2019-01-21 MED ORDER — FAMOTIDINE 20 MG PO TABS
ORAL_TABLET | ORAL | Status: AC
  Filled 2019-01-21: qty 1

## 2019-01-21 MED ORDER — METHYLENE BLUE 0.5 % INJ SOLN
INTRAVENOUS | Status: DC | PRN
  Administered 2019-01-21: 2 mL

## 2019-01-21 SURGICAL SUPPLY — 22 items
BNDG EYE OVAL (GAUZE/BANDAGES/DRESSINGS) ×6 IMPLANT
CANISTER SUCT 1200ML W/VALVE (MISCELLANEOUS) ×3 IMPLANT
COVER WAND RF STERILE (DRAPES) ×3 IMPLANT
DEPRESSOR TONGUE BLADE STERILE (MISCELLANEOUS) ×3 IMPLANT
DRSG TELFA 4X3 1S NADH ST (GAUZE/BANDAGES/DRESSINGS) ×3 IMPLANT
GAUZE SPONGE 4X4 12PLY STRL (GAUZE/BANDAGES/DRESSINGS) ×3 IMPLANT
GLOVE BIO SURGEON STRL SZ7.5 (GLOVE) ×3 IMPLANT
IV SET EXTENSION MINI BORE EPI (IV SETS) ×3 IMPLANT
KIT TURNOVER KIT A (KITS) ×3 IMPLANT
LABEL OR SOLS (LABEL) ×3 IMPLANT
LASER CO2 ACUPULSE DUO (Laser) ×6 IMPLANT
MICROMANIPULATOR ACUSPOT 712 (Laser) ×3 IMPLANT
NDL ENDOSCOPIC URO 20G (NEEDLE) ×3 IMPLANT
NEEDLE HYPO 27GX1-1/4 (NEEDLE) ×3 IMPLANT
NO ADDITIONAL CHARGE (MISCELLANEOUS) ×3 IMPLANT
PACK HEAD/NECK (MISCELLANEOUS) ×3 IMPLANT
PATTIES SURGICAL .5 X.5 (GAUZE/BANDAGES/DRESSINGS) ×3 IMPLANT
SOL ANTI-FOG 6CC FOG-OUT (MISCELLANEOUS) ×1 IMPLANT
SOL FOG-OUT ANTI-FOG 6CC (MISCELLANEOUS) ×2
TUBING SMOKE CO2 EVAC STRL (MISCELLANEOUS) ×3 IMPLANT
TUBING SMOKE EVAC 6FT (TUBING) ×3 IMPLANT
WATER STERILE IRR 1000ML POUR (IV SOLUTION) ×3 IMPLANT

## 2019-01-21 NOTE — H&P (Signed)
History and physical reviewed and will be scanned in later. No change in medical status reported by the patient or family, appears stable for surgery. All questions regarding the procedure answered, and patient (or family if a child) expressed understanding of the procedure. ? ?Bradley Edwards ?@TODAY@ ?

## 2019-01-21 NOTE — Anesthesia Preprocedure Evaluation (Signed)
Anesthesia Evaluation  Patient identified by MRN, date of birth, ID band Patient awake    Reviewed: Allergy & Precautions, H&P , NPO status , Patient's Chart, lab work & pertinent test results  History of Anesthesia Complications Negative for: history of anesthetic complications  Airway Mallampati: I  TM Distance: >3 FB Neck ROM: full    Dental  (+) Poor Dentition, Missing, Upper Dentures, Lower Dentures   Pulmonary neg shortness of breath, COPD, Current Smoker and Patient abstained from smoking.,           Cardiovascular Exercise Tolerance: Good hypertension, (-) angina(-) Past MI and (-) DOE      Neuro/Psych negative neurological ROS  negative psych ROS   GI/Hepatic negative GI ROS, Neg liver ROS, neg GERD  ,  Endo/Other  negative endocrine ROS  Renal/GU      Musculoskeletal   Abdominal   Peds  Hematology negative hematology ROS (+)   Anesthesia Other Findings Past Medical History: No date: Cancer ([place])     Comment:  skin-melanoma No date: Cataract No date: Dysrhythmia     Comment:  PVC, cardiac clearance Dr Ubaldo Glassing 09/25/2017 No date: Hypertension     Comment:  controlled on med No date: Lesion of larynx No date: Wears dentures     Comment:  upper and lower  Past Surgical History: No date: CATARACT EXTRACTION, BILATERAL 10/08/2017: DIRECT LARYNGOSCOPY; N/A     Comment:  Procedure: DIRECT LARYNGOSCOPY WITH BIOPSY OF EPIGLOTIS               LESION;  Surgeon: Clyde Canterbury, MD;  Location: [facility];  Service: ENT;  Laterality: N/A; No date: HERNIA REPAIR     Comment:  umbilical [id number]: MELANOMA EXCISION  BMI    Body Mass Index: 23.13 kg/m      Reproductive/Obstetrics negative OB ROS                             Anesthesia Physical Anesthesia Plan  ASA: III  Anesthesia Plan: General ETT   Post-op Pain Management:    Induction: Intravenous  PONV  Risk Score and Plan: Ondansetron, Dexamethasone, Midazolam and Treatment may vary due to age or medical condition  Airway Management Planned: Oral ETT  Additional Equipment:   Intra-op Plan:   Post-operative Plan: Extubation in OR  Informed Consent: I have reviewed the patients History and Physical, chart, labs and discussed the procedure including the risks, benefits and alternatives for the proposed anesthesia with the patient or authorized representative who has indicated his/her understanding and acceptance.     Dental Advisory Given  Plan Discussed with: Anesthesiologist, CRNA and Surgeon  Anesthesia Plan Comments: (Patient consented for risks of anesthesia including but not limited to:  - adverse reactions to medications - damage to teeth, lips or other oral mucosa - sore throat or hoarseness - Damage to heart, brain, lungs or loss of life  Patient voiced understanding.)        Anesthesia Quick Evaluation

## 2019-01-21 NOTE — Op Note (Signed)
01/21/2019  9:53 AM    Dorthula Perfect  [id number]    Pre-Op Diagnosis:  malignant neoplasm larynx  Post-op Diagnosis: malignant neoplasm larynx  Procedure:  Microlaryngoscopy with excision of epiglottis lesion, CO2 laser excision  Surgeon:  Riley Nearing., MD  Anesthesia:  General Endotracheal  EBL:  minimal  Complications:  None  Findings:  Exophytic lesion laryngeal surface of the epiglottis, approximately 6x85mm just to left of midline. Possible focal deep involvement of the perichondrium, cartilage  Procedure: With the patient in a comfortable supine position, general endotracheal anesthesia was induced without difficulty.  At an appropriate level, the table was turned 90 degrees away from Anesthesia.  A clean preparation and draping was performed in the standard fashion.  A wet Raytek was used to protect the gums. Using the Dedo laryngoscope, the oropharynx, hypopharynx and larynx were carefully inspected.   The patient was placed in suspension and the epiglottic lesion excised with a sickle blade and microlaryngeal scissors using microsurgical technique under the operating microscope. The deep margin was biopsied separately where there was concern for possible deep involvement. Bleeding was minimal. The patient was then draped with wet towels for protection and the CO2 laser, on 5 watts slightly defocused, was used to further ablate the margins of the excision and some residual abnormal appearing tissue in the base of the wound.   The findings were as described above.  The laryngoscope was removed.  The neck was palpated on both sides with no adenopathy noted.   At this point the procedure was completed.  Dental status was intact.  The patient was returned to Anesthesia, awakened, extubated, and transferred to PACU in satisfactory condition.   Disposition: To PACU, then discharge home  Plan: Soft, bland diet, advance as tolerated. Take pain medications as prescribed.  Follow-up in 3 weeks.  Riley Nearing 01/21/2019 9:53 AM

## 2019-01-21 NOTE — Anesthesia Postprocedure Evaluation (Signed)
Anesthesia Post Note  Patient: Bradley Edwards  Procedure(s) Performed: MICROLARYNGOSCOPY (N/A )  Patient location during evaluation: PACU Anesthesia Type: General Level of consciousness: awake and alert Pain management: pain level controlled Vital Signs Assessment: post-procedure vital signs reviewed and stable Respiratory status: spontaneous breathing, nonlabored ventilation, respiratory function stable and patient connected to nasal cannula oxygen Cardiovascular status: blood pressure returned to baseline and stable Postop Assessment: no apparent nausea or vomiting Anesthetic complications: no     Last Vitals:  Vitals:   01/21/19 1036 01/21/19 1047  BP: (!) 142/64 (!) 169/77  Pulse: 60 81  Resp: 15 18  Temp: 36.6 C 36.6 C  SpO2: 98% 97%    Last Pain:  Vitals:   01/21/19 1047  TempSrc: Temporal  PainSc: 0-No pain                 Precious Haws Lyrik Buresh

## 2019-01-21 NOTE — Anesthesia Post-op Follow-up Note (Signed)
Anesthesia QCDR form completed.        

## 2019-01-21 NOTE — Discharge Instructions (Signed)

## 2019-01-21 NOTE — Transfer of Care (Signed)
Immediate Anesthesia Transfer of Care Note  Patient: Bradley Edwards  Procedure(s) Performed: MICROLARYNGOSCOPY (N/A )  Patient Location: PACU  Anesthesia Type:General  Level of Consciousness: awake and alert   Airway & Oxygen Therapy: Patient Spontanous Breathing and Patient connected to face mask oxygen  Post-op Assessment: Report given to RN and Post -op Vital signs reviewed and stable  Post vital signs: Reviewed and stable  Last Vitals:  Vitals Value Taken Time  BP    Temp    Pulse 64 01/21/19 1006  Resp 16 01/21/19 1006  SpO2 99 % 01/21/19 1006  Vitals shown include unvalidated device data.  Last Pain:  Vitals:   01/21/19 0737  TempSrc: Tympanic  PainSc: 0-No pain         Complications: No apparent anesthesia complications

## 2019-01-21 NOTE — Anesthesia Procedure Notes (Signed)
Procedure Name: Intubation Performed by: Philbert Riser, CRNA Pre-anesthesia Checklist: Patient identified, Emergency Drugs available, Suction available, Patient being monitored and Timeout performed Patient Re-evaluated:Patient Re-evaluated prior to induction Oxygen Delivery Method: Circle system utilized and Simple face mask Preoxygenation: Pre-oxygenation with 100% oxygen Induction Type: IV induction Ventilation: Mask ventilation without difficulty Laryngoscope Size: McGraph and 3 Grade View: Grade I Laser Tube: Cuffed inflated with minimal occlusive pressure - saline and Laser Tube Tube size: 6.0 mm Number of attempts: 1 Placement Confirmation: ETT inserted through vocal cords under direct vision,  positive ETCO2 and breath sounds checked- equal and bilateral Secured at: 21 cm Tube secured with: Tape Dental Injury: Teeth and Oropharynx as per pre-operative assessment

## 2019-01-22 ENCOUNTER — Encounter: Payer: Self-pay | Admitting: Otolaryngology

## 2019-01-22 LAB — SURGICAL PATHOLOGY

## 2019-01-27 NOTE — Addendum Note (Signed)
Addendum  created 01/27/19 1313 by Doreen Salvage, CRNA   Charge Capture section accepted

## 2019-02-11 DIAGNOSIS — C329 Malignant neoplasm of larynx, unspecified: Secondary | ICD-10-CM | POA: Diagnosis not present

## 2019-03-25 DIAGNOSIS — C329 Malignant neoplasm of larynx, unspecified: Secondary | ICD-10-CM | POA: Diagnosis not present

## 2019-05-06 DIAGNOSIS — C329 Malignant neoplasm of larynx, unspecified: Secondary | ICD-10-CM | POA: Diagnosis not present

## 2019-06-17 DIAGNOSIS — Z08 Encounter for follow-up examination after completed treatment for malignant neoplasm: Secondary | ICD-10-CM | POA: Diagnosis not present

## 2019-06-17 DIAGNOSIS — Z8521 Personal history of malignant neoplasm of larynx: Secondary | ICD-10-CM | POA: Diagnosis not present

## 2019-08-04 DIAGNOSIS — Z8521 Personal history of malignant neoplasm of larynx: Secondary | ICD-10-CM | POA: Diagnosis not present

## 2019-08-06 ENCOUNTER — Ambulatory Visit: Payer: Medicare Other | Attending: Internal Medicine

## 2019-08-06 DIAGNOSIS — Z23 Encounter for immunization: Secondary | ICD-10-CM

## 2019-08-06 NOTE — Progress Notes (Signed)
   Covid-19 Vaccination Clinic  Name:  Bradley Edwards    MRN: [id number] DOB: 04-27-1951  08/06/2019  [name] was observed post Covid-19 immunization for 15 minutes without incident. He was provided with Vaccine Information Sheet and instruction to access the V-Safe system.   [name] was instructed to call 911 with any severe reactions post vaccine: Marland Kitchen Difficulty breathing  . Swelling of face and throat  . A fast heartbeat  . A bad rash all over body  . Dizziness and weakness   Immunizations Administered    Name Date Dose VIS Date Route   Pfizer COVID-19 Vaccine 08/06/2019  8:56 AM 0.3 mL 05/01/2019 Intramuscular   Manufacturer: [place]   Lot: [id number]   [place]: [id number]

## 2019-09-01 ENCOUNTER — Ambulatory Visit: Payer: Medicare Other | Attending: Internal Medicine

## 2019-09-01 DIAGNOSIS — Z23 Encounter for immunization: Secondary | ICD-10-CM

## 2019-09-01 NOTE — Progress Notes (Signed)
   Covid-19 Vaccination Clinic  Name:  Bradley Edwards    MRN: [id number] DOB: 1951/05/09  09/01/2019  [name] was observed post Covid-19 immunization for 15 minutes without incident. He was provided with Vaccine Information Sheet and instruction to access the V-Safe system.   [name] was instructed to call 911 with any severe reactions post vaccine: Marland Kitchen Difficulty breathing  . Swelling of face and throat  . A fast heartbeat  . A bad rash all over body  . Dizziness and weakness   Immunizations Administered    Name Date Dose VIS Date Route   Pfizer COVID-19 Vaccine 09/01/2019  8:54 AM 0.3 mL 05/01/2019 Intramuscular   Manufacturer: [place]   Lot: [id number]   [place]: [id number]

## 2019-09-30 DIAGNOSIS — H6121 Impacted cerumen, right ear: Secondary | ICD-10-CM | POA: Diagnosis not present

## 2019-09-30 DIAGNOSIS — Z8521 Personal history of malignant neoplasm of larynx: Secondary | ICD-10-CM | POA: Diagnosis not present

## 2019-12-23 ENCOUNTER — Other Ambulatory Visit: Payer: Self-pay

## 2019-12-23 ENCOUNTER — Encounter: Payer: Self-pay | Admitting: Radiation Oncology

## 2019-12-24 ENCOUNTER — Ambulatory Visit
Admission: RE | Admit: 2019-12-24 | Discharge: 2019-12-24 | Disposition: A | Discharge status: Home / Self Care | Payer: Medicare Other | Source: Ambulatory Visit | Attending: Radiation Oncology | Admitting: Radiation Oncology

## 2019-12-24 DIAGNOSIS — Z85818 Personal history of malignant neoplasm of other sites of lip, oral cavity, and pharynx: Secondary | ICD-10-CM | POA: Diagnosis not present

## 2019-12-24 DIAGNOSIS — C321 Malignant neoplasm of supraglottis: Secondary | ICD-10-CM

## 2019-12-24 DIAGNOSIS — Z923 Personal history of irradiation: Secondary | ICD-10-CM | POA: Insufficient documentation

## 2019-12-24 NOTE — Progress Notes (Signed)
Radiation Oncology Follow up Note  Name: YVONNE PETITE   Date:   12/24/2019 MRN:  [id number] DOB: [birth year]    This 69 y.o. male presents to the clinic today for 2-year follow-up status post radiation therapy for stage I squamous cell carcinoma epiglottis.  REFERRING PROVIDER: Jennette Kettle, MD  HPI: Patient is a 69 year old male now out 2 years having completed radiation therapy for stage I squamous cell carcinoma epiglottis seen today in routine follow-up he is doing well specifically denies dysphagia head and neck pain.  His loss of taste is completely resolved.  He is seeing Dr. Richardson Landry on a regular basis and has no evidence of disease by upper endoscopy..  COMPLICATIONS OF TREATMENT: none  FOLLOW UP COMPLIANCE: keeps appointments   PHYSICAL EXAM:  BP (!) (P) 138/59 (BP Location: Left Arm, Patient Position: Sitting)   Pulse (!) (P) 52   Temp (!) (P) 96.4 F (35.8 C) (Tympanic)   Resp (P) 16   Wt (P) 145 lb 3.2 oz (65.9 kg)   BMI (P) 23.44 kg/m  Neck is clear without evidence of cervical or supraclavicular adenopathy.  Well-developed well-nourished patient in NAD. HEENT reveals PERLA, EOMI, discs not visualized.  Oral cavity is clear. No oral mucosal lesions are identified. Neck is clear without evidence of cervical or supraclavicular adenopathy. Lungs are clear to A&P. Cardiac examination is essentially unremarkable with regular rate and rhythm without murmur rub or thrill. Abdomen is benign with no organomegaly or masses noted. Motor sensory and DTR levels are equal and symmetric in the upper and lower extremities. Cranial nerves II through XII are grossly intact. Proprioception is intact. No peripheral adenopathy or edema is identified. No motor or sensory levels are noted. Crude visual fields are within normal range.  RADIOLOGY RESULTS: No current films to review  PLAN: Present time I am going to discontinue follow-up care.  Dr. Katherina Right equipped to do endoscopy on a routine  basis.  I be happy to reevaluate the patient anytime should further radiation therapy be indicated.  He has had an excellent clinical result and is doing well.  I would like to take this opportunity to thank you for allowing me to participate in the care of your patient.Noreene Filbert, MD

## 2020-01-07 DIAGNOSIS — Z8521 Personal history of malignant neoplasm of larynx: Secondary | ICD-10-CM | POA: Diagnosis not present

## 2020-03-07 ENCOUNTER — Encounter: Payer: Self-pay | Admitting: Emergency Medicine

## 2020-03-07 ENCOUNTER — Ambulatory Visit
Admission: EM | Admit: 2020-03-07 | Discharge: 2020-03-07 | Disposition: A | Discharge status: Home / Self Care | Payer: Medicare Other | Attending: Family Medicine | Admitting: Family Medicine

## 2020-03-07 ENCOUNTER — Other Ambulatory Visit: Payer: Self-pay

## 2020-03-07 ENCOUNTER — Ambulatory Visit (INDEPENDENT_AMBULATORY_CARE_PROVIDER_SITE_OTHER): Payer: Medicare Other

## 2020-03-07 DIAGNOSIS — R300 Dysuria: Secondary | ICD-10-CM

## 2020-03-07 DIAGNOSIS — N200 Calculus of kidney: Secondary | ICD-10-CM

## 2020-03-07 DIAGNOSIS — N401 Enlarged prostate with lower urinary tract symptoms: Secondary | ICD-10-CM

## 2020-03-07 DIAGNOSIS — R39198 Other difficulties with micturition: Secondary | ICD-10-CM | POA: Diagnosis not present

## 2020-03-07 DIAGNOSIS — K5900 Constipation, unspecified: Secondary | ICD-10-CM

## 2020-03-07 DIAGNOSIS — I739 Peripheral vascular disease, unspecified: Secondary | ICD-10-CM | POA: Diagnosis not present

## 2020-03-07 LAB — URINALYSIS, COMPLETE (UACMP) WITH MICROSCOPIC
Glucose, UA: 100 mg/dL — AB
Hgb urine dipstick: NEGATIVE
Ketones, ur: NEGATIVE mg/dL
Nitrite: NEGATIVE
Protein, ur: 100 mg/dL — AB
Specific Gravity, Urine: 1.015 (ref 1.005–1.030)
Squamous Epithelial / HPF: NONE SEEN (ref 0–5)
pH: 8.5 — ABNORMAL HIGH (ref 5.0–8.0)

## 2020-03-07 LAB — PSA: Prostatic Specific Antigen: 5.5 ng/mL — ABNORMAL HIGH (ref 0.00–4.00)

## 2020-03-07 MED ORDER — CEPHALEXIN 500 MG PO CAPS: 500.0000 mg | ORAL_CAPSULE | Freq: Two times a day (BID) | ORAL | 0 refills | Status: DC

## 2020-03-07 MED ORDER — TAMSULOSIN HCL 0.4 MG PO CAPS: 0.4000 mg | ORAL_CAPSULE | Freq: Every day | ORAL | 0 refills | Status: AC

## 2020-03-07 MED ORDER — POLYETHYLENE GLYCOL 3350 17 GM/SCOOP PO POWD: ORAL | 0 refills | Status: AC

## 2020-03-07 NOTE — ED Provider Notes (Addendum)
[facility]    CSN: [id number] Arrival date & time: 03/07/20  1001   History   Chief Complaint Chief Complaint  Patient presents with  . Dysuria   HPI  69 year old male presents with urinary symptoms.  Patient reports over the past 8 to 9 months he has had some difficulty urinating.  Weaker stream.  At times, has difficulty urinating.  Seems to wax and wane.  Over the past 3 days he has developed dysuria.  He states that he has had a lot of difficulty urinating.  He states that he will often dribble before he gets to the restroom.  Takes a long time to urinate and he urinates very little.  No flank pain.  No relieving factors.  No reports of hematuria.  No other complaints at this time.  Past Medical History:  Diagnosis Date  . Cancer (HCC)    skin-melanoma  . Cataract   . Dysrhythmia    PVC, cardiac clearance Dr Ubaldo Glassing 09/25/2017  . Hypertension    controlled on med  . Lesion of larynx   . Wears dentures    upper and lower   Patient Active Problem List   Diagnosis Date Noted  . 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 CATARACT EXTRACTION, BILATERAL    . DIRECT LARYNGOSCOPY N/A 10/08/2017   Procedure: DIRECT LARYNGOSCOPY WITH BIOPSY OF EPIGLOTIS LESION;  Surgeon: Clyde Canterbury, MD;  Location: [place];  Service: ENT;  Laterality: N/A;  . HERNIA REPAIR     umbilical  . MELANOMA EXCISION  2018  . MICROLARYNGOSCOPY N/A 01/21/2019   Procedure: MICROLARYNGOSCOPY;  Surgeon: Clyde Canterbury, MD;  Location: [facility];  Service: ENT;  Laterality: N/A;  Needs CO2 laser.  Microdirect Laryngoscopy with excision epiglottis.  Dr. Richardson Landry says this will take about 45 minutes.       Home Medications    Prior to Admission medications   Medication Sig Start Date End Date Taking? Authorizing Provider  amLODipine (NORVASC) 10 MG tablet Take 10 mg by mouth daily.    Yes [provider]    buPROPion (ZYBAN) 150 MG 12 hr tablet Take 150 mg by mouth 2 (two) times daily.   Yes [provider]  lisinopril (PRINIVIL,ZESTRIL) 40 MG tablet Take 40 mg by mouth daily. am   Yes [provider]  cephALEXin (KEFLEX) 500 MG capsule Take 1 capsule (500 mg total) by mouth 2 (two) times daily. 03/07/20   Coral Spikes, DO  polyethylene glycol powder (GLYCOLAX/MIRALAX) 17 GM/SCOOP powder 17 g once or twice daily for constipation. 03/07/20   Coral Spikes, DO  tamsulosin (FLOMAX) 0.4 MG CAPS capsule Take 1 capsule (0.4 mg total) by mouth daily. 03/07/20   Coral Spikes, DO    Family History Family History  Problem Relation Age of Onset  . COPD Mother   . Cancer Paternal Uncle        Unknow type of cancer     Social History Social History   Tobacco Use  . Smoking status: Current Every Day Smoker    Packs/day: 0.50    Years: 50.00    Pack years: 25.00    Types: Cigarettes  . Smokeless tobacco: Never Used  Vaping Use  . Vaping Use: Never used  Substance Use Topics  . Alcohol use: Not Currently  . Drug use: Not Currently     Allergies   Patient has no  known allergies.   Review of Systems Review of Systems  Genitourinary: Positive for difficulty urinating and dysuria.   Physical Exam Triage Vital Signs ED Triage Vitals  Enc Vitals Group     BP 03/07/20 1056 (!) 155/80     Pulse Rate 03/07/20 1056 69     Resp 03/07/20 1056 18     Temp 03/07/20 1056 97.8 F (36.6 C)     Temp Source 03/07/20 1056 Oral     SpO2 03/07/20 1056 98 %     Weight 03/07/20 1053 145 lb (65.8 kg)     Height 03/07/20 1053 5\' 6"  (1.676 m)     Head Circumference --      Peak Flow --      Pain Score 03/07/20 1053 0     Pain Loc --      Pain Edu? --      Excl. in [place]? --    Updated Vital Signs BP (!) 155/80 (BP Location: Right Arm)   Pulse 69   Temp 97.8 F (36.6 C) (Oral)   Resp 18   Ht 5\' 6"  (1.676 m)   Wt 65.8 kg   SpO2 98%   BMI 23.40 kg/m   Visual Acuity Right  Eye Distance:   Left Eye Distance:   Bilateral Distance:    Right Eye Near:   Left Eye Near:    Bilateral Near:     Physical Exam Vitals and nursing note reviewed.  Constitutional:      General: He is not in acute distress.    Appearance: Normal appearance. He is not ill-appearing.  HENT:     Head: Normocephalic and atraumatic.  Eyes:     General:        Right eye: No discharge.        Left eye: No discharge.     Conjunctiva/sclera: Conjunctivae normal.  Cardiovascular:     Rate and Rhythm: Normal rate and regular rhythm.  Pulmonary:     Effort: Pulmonary effort is normal.     Breath sounds: Normal breath sounds. No wheezing, rhonchi or rales.  Abdominal:     General: There is no distension.     Palpations: Abdomen is soft.     Tenderness: There is no abdominal tenderness.  Neurological:     Mental Status: He is alert.  Psychiatric:        Mood and Affect: Mood normal.        Behavior: Behavior normal.    UC Treatments / Results  Labs (all labs ordered are listed, but only abnormal results are displayed) Labs Reviewed  URINALYSIS, COMPLETE (UACMP) WITH MICROSCOPIC - Abnormal; Notable for the following components:      Result Value   APPearance TURBID (*)    pH 8.5 (*)    Glucose, UA 100 (*)    Bilirubin Urine SMALL (*)    Protein, ur 100 (*)    Leukocytes,Ua SMALL (*)    Bacteria, UA FEW (*)    All other components within normal limits  URINE CULTURE  PSA   EKG  Radiology DG Abd 1 View  Result Date: 03/07/2020 CLINICAL DATA:  Patient c/o dysuria that started 3 days ago. milky white urine. Some trouble urinating. EXAM: ABDOMEN - 1 VIEW COMPARISON:  None. FINDINGS: Large amount of stool in the ascending colon. no bowel dilatation to suggest obstruction. No evidence of pneumoperitoneum, portal venous gas or pneumatosis. No pathologic calcifications along the expected course of the ureters. Bilateral nephrolithiasis.  No acute osseous abnormality. Peripheral  vascular atherosclerotic disease. IMPRESSION: Bilateral nephrolithiasis. Peripheral vascular atherosclerotic disease. Electronically Signed   By: Kathreen Devoid   On: 03/07/2020 12:14    Procedures Procedures (including critical care time)  Medications Ordered in UC Medications - No data to display  Initial Impression / Assessment and Plan / UC Course  I have reviewed the triage vital signs and the nursing notes.  Pertinent labs & imaging results that were available during my care of the patient were reviewed by me and considered in my medical decision making (see chart for details).    69 year old male presents with urinary symptoms.  Urinalysis with elevated pH.  Triple phosphate crystals noted on microscopy.  Very cloudy urine.  Concern for UTI in the setting of triple phosphate crystals/struvite crystals.  Placing empirically on Keflex while awaiting culture.  Patient has BPH as well.  Placing on Flomax.  And lastly, KUB was obtained to look for struvite stone and revealed constipation.  Advised MiraLAX.  Lots of fluids.  Final Clinical Impressions(s) / UC Diagnoses   Final diagnoses:  Dysuria  Benign prostatic hyperplasia with lower urinary tract symptoms, symptom details unspecified  Constipation, unspecified constipation type     Discharge Instructions     Medication as prescribed.  Lots of water.  Take care  Dr. Lacinda Axon    ED Prescriptions    Medication Sig Dispense Auth. Provider   tamsulosin (FLOMAX) 0.4 MG CAPS capsule Take 1 capsule (0.4 mg total) by mouth daily. 30 capsule Henchy Mccauley G, DO   cephALEXin (KEFLEX) 500 MG capsule Take 1 capsule (500 mg total) by mouth 2 (two) times daily. 14 capsule Akbar Sacra G, DO   polyethylene glycol powder (GLYCOLAX/MIRALAX) 17 GM/SCOOP powder 17 g once or twice daily for constipation. 500 g Coral Spikes, DO     PDMP not reviewed this encounter.     Coral Spikes, [place] 03/07/20 1524

## 2020-03-07 NOTE — Discharge Instructions (Signed)
Medication as prescribed.  Lots of water.  Take care  Dr. Lacinda Axon

## 2020-03-07 NOTE — ED Triage Notes (Signed)
Patient c/o dysuria that started 3 days ago.

## 2020-03-08 ENCOUNTER — Telehealth: Payer: Self-pay | Admitting: Family Medicine

## 2020-03-08 LAB — URINE CULTURE: Culture: <10000 — AB

## 2020-03-08 NOTE — Telephone Encounter (Signed)
PSA elevated @ 5.5. Placing referral to Urology.  [facility]

## 2020-03-14 NOTE — Progress Notes (Signed)
03/15/2020 10:18 AM   Bradley Edwards April 28, 1951 [id number]  Referring provider: Jennette Kettle, MD [place],   [id number] Chief Complaint  Patient presents with  . Dysuria    HPI: Bradley Edwards is a 69 y.o. male who presents today for evaluation and management of dysuria.   Patient was seen in the ED last week for dysuria. Patient reported over the past 8 to 9 months he had some difficulty urinating.  Weaker stream.  At times, had difficulty urinating. Symptoms were to waxing and waning. He stated that he will often dribble before he gets to the restroom. He noted it took him a long time to urinate and he urinated very little.  No flank pain.  No relieving factors.  No reports of hematuria.  No other complaints at that time.  Urinalysis showed elevated pH.  Triple phosphate crystals noted on microscopy.  Very cloudy urine. There was concern for UTI in the setting of triple phosphate crystals/struvite crystals. He was placed on Keflex 500 mg BID. Patient was noted to have BPH with a PSA of 5.50 and was started on Flomax. KUB was obtained to look for struvite stone and revealed constipation.  Patient was advised to take MiraLAX and push fluids.   Urinary symptoms started over a year ago and have recently worsened. He has right flank pain with urination with associated dribbling. Denies history of kidney stones. He reports dysuria started 1-2 weeks ago. He urinates every 1-2 hours a night. Since starting Flomax his urinary stream is better. Denies hematuria. He finished his antibiotics yesterday.   Patient has an extensive smoking history. He smokes 3 quarters of a ppd.   He has bilateral stone disease.    PMH: Past Medical History:  Diagnosis Date  . Cancer (HCC)    skin-melanoma  . Cataract   . Dysrhythmia    PVC, cardiac clearance Dr Ubaldo Glassing 09/25/2017  . Hypertension    controlled on med  . Lesion of larynx   . Wears dentures    upper and lower     Surgical History: Past Surgical History:  Procedure Laterality Date  . CATARACT EXTRACTION, BILATERAL    . DIRECT LARYNGOSCOPY N/A 10/08/2017   Procedure: DIRECT LARYNGOSCOPY WITH BIOPSY OF EPIGLOTIS LESION;  Surgeon: Clyde Canterbury, MD;  Location: [place];  Service: ENT;  Laterality: N/A;  . HERNIA REPAIR     umbilical  . MELANOMA EXCISION  2018  . MICROLARYNGOSCOPY N/A 01/21/2019   Procedure: MICROLARYNGOSCOPY;  Surgeon: Clyde Canterbury, MD;  Location: [facility];  Service: ENT;  Laterality: N/A;  Needs CO2 laser.  Microdirect Laryngoscopy with excision epiglottis.  Dr. Richardson Landry says this will take about 45 minutes.    Home Medications:  Allergies as of 03/15/2020   No Known Allergies     Medication List       Accurate as of March 15, 2020 11:59 PM. If you have any questions, ask your nurse or doctor.        STOP taking these medications   cephALEXin 500 MG capsule Commonly known as: KEFLEX Stopped by: Hollice Espy, MD     TAKE these medications   amLODipine 10 MG tablet Commonly known as: NORVASC Take 10 mg by mouth daily.   buPROPion 150 MG 12 hr tablet Commonly known as: ZYBAN Take 150 mg by mouth 2 (two) times daily.   lisinopril 40 MG tablet Commonly known as: ZESTRIL Take 40 mg by mouth  daily. am   polyethylene glycol powder 17 GM/SCOOP powder Commonly known as: GLYCOLAX/MIRALAX 17 g once or twice daily for constipation.   tamsulosin 0.4 MG Caps capsule Commonly known as: FLOMAX Take 1 capsule (0.4 mg total) by mouth daily.       Allergies: No Known Allergies  Family History: Family History  Problem Relation Age of Onset  . COPD Mother   . Cancer Paternal Uncle        Unknow type of cancer     Social History:  reports that he has been smoking cigarettes. He has a 25.00 pack-year smoking history. He has never used smokeless tobacco. He reports previous alcohol use. He reports previous drug use.   Physical Exam: BP (!) 145/74    Pulse 70   Ht 5\' 6"  (1.676 m)   Wt 145 lb (65.8 kg)   BMI 23.40 kg/m   Constitutional:  Alert and oriented, No acute distress. HEENT: [place] AT, moist mucus membranes.  Trachea midline, no masses. Cardiovascular: No clubbing, cyanosis, or edema. Respiratory: Normal respiratory effort, no increased work of breathing. GI: Abdomen is soft, nontender, nondistended, no abdominal masses GU: No CVA tenderness. 50+ cc prostate Skin: No rashes, bruises or suspicious lesions. Neurologic: Grossly intact, no focal deficits, moving all 4 extremities. Psychiatric: Normal mood and affect.  Laboratory Data:  Lab Results  Component Value Date   CREATININE 1.11 03/25/2020   Urinalysis Results for orders placed or performed in visit on 03/15/20  CULTURE, URINE COMPREHENSIVE   Specimen: Urine   UR  Result Value Ref Range   Urine Culture, Comprehensive Final report    Organism ID, Bacteria Comment   Microscopic Examination   Urine  Result Value Ref Range   WBC, UA 6-10 (A) 0 - 5 /hpf   RBC 11-30 (A) 0 - 2 /hpf   Epithelial Cells (non renal) 0-10 0 - 10 /hpf   Casts Present (A) None seen /lpf   Cast Type Granular casts (A) N/A   Crystals Present (A) N/A   Crystal Type Amorphous Sediment N/A   Bacteria, UA Few None seen/Few  Urinalysis, Complete  Result Value Ref Range   Specific Gravity, UA 1.025 1.005 - 1.030   pH, UA 7.0 5.0 - 7.5   Color, UA Orange Yellow   Appearance Ur Cloudy (A) Clear   Leukocytes,UA Trace (A) Negative   Protein,UA Trace (A) Negative/Trace   Glucose, UA Trace (A) Negative   Ketones, UA Negative Negative   RBC, UA 2+ (A) Negative   Bilirubin, UA Negative Negative   Urobilinogen, Ur 1.0 0.2 - 1.0 mg/dL   Nitrite, UA Negative Negative   Microscopic Examination See below:      Pertinent Imaging: Results for orders placed during the hospital encounter of 03/07/20  DG Abd 1 View  Narrative CLINICAL DATA:  Patient c/o dysuria that started 3 days ago. milky white  urine. Some trouble urinating.  EXAM: ABDOMEN - 1 VIEW  COMPARISON:  None.  FINDINGS: Large amount of stool in the ascending colon. no bowel dilatation to suggest obstruction. No evidence of pneumoperitoneum, portal venous gas or pneumatosis.  No pathologic calcifications along the expected course of the ureters. Bilateral nephrolithiasis.  No acute osseous abnormality. Peripheral vascular atherosclerotic disease.  IMPRESSION: Bilateral nephrolithiasis.  Peripheral vascular atherosclerotic disease.   Electronically Signed By: Kathreen Devoid On: 03/07/2020 12:14   I have personally reviewed the images and agree with radiologist interpretation.    Assessment & Plan:  1. Microscopic hematuria Patient has an extensive smoking history.  Overall, high risk for GU pathology.  We discussed the differential diagnosis for microscopic hematuria including nephrolithiasis, renal or upper tract tumors, bladder stones, UTIs, or bladder tumors as well as undetermined etiologies. Per AUA guidelines, I did recommend complete microscopic hematuria evaluation including CTU, possible urine cytology, and office cystoscopy.  2. Dysuria UA today is borderline suspicious with 6-10 WBC, 11-30 RBC, nitrite negative, and few bacteria This may be related to prostatitis.  Continue Flomax  -Urine culture sent  3. Right flank pain There were some crystals present in his urine Will further assess on CTU  4. Smoking  Extensive smoking history Encourage smoking cessation   5. Elevated PSA Unknown baseline DRE today reveals a markedly enlarged prostate. Will repeat PSA in 3 months (avoid checking today in the setting of acute urinary symptoms)  6. BPH with urinary obstruction Prostatomegaly Patient will benefit from outlet procedure-we will discuss further at subsequent visits Continue [place] [address], [place], [place]  [address] [phone]  I, Selena Batten, am acting as a scribe for Dr. Hollice Espy.  I have reviewed the above documentation for accuracy and completeness, and I agree with the above.   Hollice Espy, MD

## 2020-03-15 ENCOUNTER — Other Ambulatory Visit: Payer: Self-pay

## 2020-03-15 ENCOUNTER — Ambulatory Visit (INDEPENDENT_AMBULATORY_CARE_PROVIDER_SITE_OTHER): Payer: Medicare Other | Admitting: Urology

## 2020-03-15 VITALS — BP 145/74 | HR 70 | Ht 66.0 in | Wt 145.0 lb

## 2020-03-15 DIAGNOSIS — R972 Elevated prostate specific antigen [PSA]: Secondary | ICD-10-CM

## 2020-03-15 DIAGNOSIS — R3129 Other microscopic hematuria: Secondary | ICD-10-CM

## 2020-03-15 DIAGNOSIS — R300 Dysuria: Secondary | ICD-10-CM

## 2020-03-15 DIAGNOSIS — Z720 Tobacco use: Secondary | ICD-10-CM | POA: Diagnosis not present

## 2020-03-15 DIAGNOSIS — N138 Other obstructive and reflux uropathy: Secondary | ICD-10-CM

## 2020-03-15 DIAGNOSIS — R109 Unspecified abdominal pain: Secondary | ICD-10-CM | POA: Diagnosis not present

## 2020-03-15 DIAGNOSIS — N401 Enlarged prostate with lower urinary tract symptoms: Secondary | ICD-10-CM

## 2020-03-15 LAB — URINALYSIS, COMPLETE
Bilirubin, UA: NEGATIVE
Ketones, UA: NEGATIVE
Nitrite, UA: NEGATIVE
Specific Gravity, UA: 1.025 (ref 1.005–1.030)
Urobilinogen, Ur: 1 mg/dL (ref 0.2–1.0)
pH, UA: 7 (ref 5.0–7.5)

## 2020-03-15 LAB — MICROSCOPIC EXAMINATION

## 2020-03-15 NOTE — Patient Instructions (Signed)
Cystoscopy Cystoscopy is a procedure that is used to help diagnose and sometimes treat conditions that affect the lower urinary tract. The lower urinary tract includes the bladder and the urethra. The urethra is the tube that drains urine from the bladder. Cystoscopy is done using a thin, tube-shaped instrument with a light and camera at the end (cystoscope). The cystoscope may be hard or flexible, depending on the goal of the procedure. The cystoscope is inserted through the urethra, into the bladder. Cystoscopy may be recommended if you have:  Urinary tract infections that keep coming back.  Blood in the urine (hematuria).  An inability to control when you urinate (urinary incontinence) or an overactive bladder.  Unusual cells found in a urine sample.  A blockage in the urethra, such as a urinary stone.  Painful urination.  An abnormality in the bladder found during an intravenous pyelogram (IVP) or CT scan. Cystoscopy may also be done to remove a sample of tissue to be examined under a microscope (biopsy). What are the risks? Generally, this is a safe procedure. However, problems may occur, including:  Infection.  Bleeding.  What happens during the procedure?  1. You will be given one or more of the following: ? A medicine to numb the area (local anesthetic). 2. The area around the opening of your urethra will be cleaned. 3. The cystoscope will be passed through your urethra into your bladder. 4. Germ-free (sterile) fluid will flow through the cystoscope to fill your bladder. The fluid will stretch your bladder so that your health care provider can clearly examine your bladder walls. 5. Your doctor will look at the urethra and bladder. 6. The cystoscope will be removed The procedure may vary among health care providers  What can I expect after the procedure? After the procedure, it is common to have: 1. Some soreness or pain in your abdomen and urethra. 2. Urinary symptoms.  These include: ? Mild pain or burning when you urinate. Pain should stop within a few minutes after you urinate. This may last for up to 1 week. ? A small amount of blood in your urine for several days. ? Feeling like you need to urinate but producing only a small amount of urine. Follow these instructions at home: General instructions  Return to your normal activities as told by your health care provider.   Do not drive for 24 hours if you were given a sedative during your procedure.  Watch for any blood in your urine. If the amount of blood in your urine increases, call your health care provider.  If a tissue sample was removed for testing (biopsy) during your procedure, it is up to you to get your test results. Ask your health care provider, or the department that is doing the test, when your results will be ready.  Drink enough fluid to keep your urine pale yellow.  Keep all follow-up visits as told by your health care provider. This is important. Contact a health care provider if you:  Have pain that gets worse or does not get better with medicine, especially pain when you urinate.  Have trouble urinating.  Have more blood in your urine. Get help right away if you:  Have blood clots in your urine.  Have abdominal pain.  Have a fever or chills.  Are unable to urinate. Summary  Cystoscopy is a procedure that is used to help diagnose and sometimes treat conditions that affect the lower urinary tract.  Cystoscopy is done using   a thin, tube-shaped instrument with a light and camera at the end.  After the procedure, it is common to have some soreness or pain in your abdomen and urethra.  Watch for any blood in your urine. If the amount of blood in your urine increases, call your health care provider.  If you were prescribed an antibiotic medicine, take it as told by your health care provider. Do not stop taking the antibiotic even if you start to feel better. This  information is not intended to replace advice given to you by your health care provider. Make sure you discuss any questions you have with your health care provider. Document Revised: 04/29/2018 Document Reviewed: 04/29/2018 Elsevier Patient Education  2020 Elsevier Inc.   

## 2020-03-18 LAB — CULTURE, URINE COMPREHENSIVE

## 2020-03-22 ENCOUNTER — Other Ambulatory Visit: Payer: Self-pay

## 2020-03-22 DIAGNOSIS — R3129 Other microscopic hematuria: Secondary | ICD-10-CM

## 2020-03-25 ENCOUNTER — Other Ambulatory Visit: Payer: Self-pay

## 2020-03-25 ENCOUNTER — Ambulatory Visit
Admission: RE | Admit: 2020-03-25 | Discharge: 2020-03-25 | Disposition: A | Discharge status: Home / Self Care | Payer: Medicare Other | Source: Ambulatory Visit | Attending: Urology | Admitting: Urology

## 2020-03-25 ENCOUNTER — Other Ambulatory Visit
Admission: RE | Admit: 2020-03-25 | Discharge: 2020-03-25 | Disposition: A | Discharge status: Home / Self Care | Payer: Medicare Other | Source: Ambulatory Visit | Attending: Urology | Admitting: Urology

## 2020-03-25 DIAGNOSIS — R3129 Other microscopic hematuria: Secondary | ICD-10-CM

## 2020-03-25 DIAGNOSIS — N1330 Unspecified hydronephrosis: Secondary | ICD-10-CM | POA: Diagnosis not present

## 2020-03-25 DIAGNOSIS — N281 Cyst of kidney, acquired: Secondary | ICD-10-CM | POA: Diagnosis not present

## 2020-03-25 DIAGNOSIS — K4090 Unilateral inguinal hernia, without obstruction or gangrene, not specified as recurrent: Secondary | ICD-10-CM | POA: Diagnosis not present

## 2020-03-25 LAB — CREATININE, SERUM
Creatinine, Ser: 1.11 mg/dL (ref 0.61–1.24)
GFR, Estimated: >60 mL/min (ref >60)

## 2020-03-25 MED ORDER — IOHEXOL 300 MG/ML  SOLN
115.0000 mL | Freq: Once | INTRAMUSCULAR | Status: AC | PRN
  Administered 2020-03-25: 115 mL via INTRAVENOUS

## 2020-03-29 ENCOUNTER — Other Ambulatory Visit: Payer: Self-pay | Admitting: Family Medicine

## 2020-03-30 ENCOUNTER — Encounter: Payer: Self-pay | Admitting: Urology

## 2020-03-30 ENCOUNTER — Other Ambulatory Visit: Payer: Self-pay

## 2020-03-30 ENCOUNTER — Ambulatory Visit (INDEPENDENT_AMBULATORY_CARE_PROVIDER_SITE_OTHER): Payer: Medicare Other | Admitting: Urology

## 2020-03-30 ENCOUNTER — Other Ambulatory Visit: Payer: Self-pay | Admitting: Urology

## 2020-03-30 VITALS — BP 136/63 | HR 87

## 2020-03-30 DIAGNOSIS — N401 Enlarged prostate with lower urinary tract symptoms: Secondary | ICD-10-CM

## 2020-03-30 DIAGNOSIS — R109 Unspecified abdominal pain: Secondary | ICD-10-CM

## 2020-03-30 DIAGNOSIS — N138 Other obstructive and reflux uropathy: Secondary | ICD-10-CM

## 2020-03-30 DIAGNOSIS — R339 Retention of urine, unspecified: Secondary | ICD-10-CM

## 2020-03-30 DIAGNOSIS — R3129 Other microscopic hematuria: Secondary | ICD-10-CM

## 2020-03-30 LAB — BLADDER SCAN AMB NON-IMAGING: Scan Result: 388

## 2020-03-30 NOTE — Patient Instructions (Signed)

## 2020-03-30 NOTE — Progress Notes (Signed)
   03/30/20  CC:  Chief Complaint  Patient presents with  . Cysto    HPI: 69 year old male with worsening obstructive urinary symptoms as well as right flank pain with urination who presents today for cystoscopic evaluation.  Since last visit, he underwent CT urogram with multiple findings including mild right hydronephrosis as well as a slightly irregular right lower pole irregular calyx somewhat distorted by multiple cyst.  He also has enhancing soft tissue that protrudes to the base of the bladder lumen presumably related to the prostate gland.  Sequela of bladder outlet obstruction including multiple diverticula are also appreciated.  PVR 388  Blood pressure 136/63, pulse 87. NED. A&Ox3.   No respiratory distress   Abd soft, NT, ND Normal phallus with bilateral descended testicles  Cystoscopy Procedure Note  Patient identification was confirmed, informed consent was obtained, and patient was prepped using Betadine solution.  Lidocaine jelly was administered per urethral meatus.     Pre-Procedure: - Inspection reveals a normal caliber ureteral meatus.  Procedure: The flexible cystoscope was introduced without difficulty - No urethral strictures/lesions are present. - Enlarged prostate with trilobar coaptation - Elevated bladder neck - Bilateral ureteral orifices identified - Bladder mucosa  reveals no ulcers, tumors, or lesions although visualization was slightly suboptimal today due to debris - No bladder stones -Heavy trabeculation with saccules and diverticula throughout bladder  Retroflexion shows slight intravesical protrusion but no evidence of urothelial tumors or discrete median lobe   Post-Procedure: - Patient tolerated the procedure well  Assessment/ Plan:  1. Microscopic hematuria Cystoscopic evaluation today unremarkable although quality of cystoscopy was slightly suboptimal  Urine cytology today  No obvious large bladder tumors  CT scan  personally reviewed, there is an abnormality of the lower pole calyx, significance unknown.  May consider repeat imaging versus retrograde pyelogram down the road.  - Urinalysis, Complete - Cytology, urine (performed at [facility]) - CULTURE, URINE COMPREHENSIVE - BLADDER SCAN AMB NON-IMAGING - Ambulatory referral to Urology  2. Benign prostatic hyperplasia with urinary obstruction Large prostate with severe sequela of chronic bladder outlet obstruction appreciated today  I suspect he may have a very high pressure bladder possibly with pop-off valve to the right kidney related to chronic outlet obstruction  He would likely benefit from an outlet procedure, briefly discussed holep versus TURP versus UroLift  He is hesitant to pursue any of this at this point in time.  He is interested in urodynamics to help further evaluate his bladder function, bladder pressures, etiology of right hydronephrosis.  We discussed urodynamics at length today.  He is agreeable have this done.  We will arrange for the [facility] and follow-up with me thereafter.  3. Incomplete bladder emptying As above  I like him to start self cathing several times a day to keep his bladder emptying, discussed risk and benefits and ultimately he declined  Warning symptoms for urinary retention were reviewed  4. Right flank pain/mild right hydronephrosis Etiology unclear, question whether or not this may be related to reflux as it only happens when he is trying to void/high bladder pressure  We will evaluate with above UDS   Plan for urodynamics, return once is completed  Hollice Espy, MD

## 2020-03-31 LAB — MICROSCOPIC EXAMINATION: RBC, Urine: >30 /hpf — AB (ref 0–2)

## 2020-03-31 LAB — URINALYSIS, COMPLETE
Bilirubin, UA: NEGATIVE
Ketones, UA: NEGATIVE
Nitrite, UA: NEGATIVE
Specific Gravity, UA: 1.02 (ref 1.005–1.030)
Urobilinogen, Ur: 4 mg/dL — ABNORMAL HIGH (ref 0.2–1.0)
pH, UA: 7.5 (ref 5.0–7.5)

## 2020-03-31 LAB — CYTOLOGY - NON PAP

## 2020-04-01 ENCOUNTER — Telehealth: Payer: Self-pay | Admitting: *Deleted

## 2020-04-01 ENCOUNTER — Encounter: Payer: Self-pay | Admitting: Physician Assistant

## 2020-04-01 ENCOUNTER — Ambulatory Visit (INDEPENDENT_AMBULATORY_CARE_PROVIDER_SITE_OTHER): Payer: Medicare Other | Admitting: Physician Assistant

## 2020-04-01 ENCOUNTER — Other Ambulatory Visit: Payer: Self-pay

## 2020-04-01 VITALS — BP 150/70 | HR 111 | Temp 97.7°F | Ht 64.0 in | Wt 145.0 lb

## 2020-04-01 DIAGNOSIS — R338 Other retention of urine: Secondary | ICD-10-CM | POA: Diagnosis not present

## 2020-04-01 DIAGNOSIS — R1084 Generalized abdominal pain: Secondary | ICD-10-CM | POA: Diagnosis not present

## 2020-04-01 LAB — URINALYSIS, COMPLETE
Bilirubin, UA: NEGATIVE
Nitrite, UA: NEGATIVE
Specific Gravity, UA: 1.02 (ref 1.005–1.030)
Urobilinogen, Ur: 2 mg/dL — ABNORMAL HIGH (ref 0.2–1.0)
pH, UA: 7.5 (ref 5.0–7.5)

## 2020-04-01 LAB — MICROSCOPIC EXAMINATION: RBC, Urine: >30 /hpf — AB (ref 0–2)

## 2020-04-01 LAB — BLADDER SCAN AMB NON-IMAGING: Scan Result: 205

## 2020-04-01 MED ORDER — SULFAMETHOXAZOLE-TRIMETHOPRIM 800-160 MG PO TABS: 1.0000 | ORAL_TABLET | Freq: Two times a day (BID) | ORAL | 0 refills | Status: AC

## 2020-04-01 NOTE — Progress Notes (Signed)
In and Out Catheterization  Patient is present today for a I & O catheterization due to RLQ pain. Patient was cleaned and prepped in a sterile fashion with betadine . A 16FR Coude cath was inserted no complications were noted , 150 ml of urine return was noted, urine was brown in color. A clean urine sample was collected for UA. Bladder was drained  And catheter was removed with out difficulty.    Preformed by: Kerman Passey, RMA, and Rebeca Morris CMA  Follow up/ Additional notes:

## 2020-04-01 NOTE — Progress Notes (Signed)
04/01/2020 1:07 PM   Bradley Edwards 1950-11-16 [id number]  CC: Chief Complaint  Patient presents with  . Abdominal Pain   HPI: Bradley Edwards is a 69 y.o. male with a history of microscopic hematuria, BPH with urinary obstruction, and mild right hydronephrosis with right flank pain who presents today for evaluation of abdominal and right flank pain.  He underwent cystoscopy with Dr. Erlene Quan 2 days ago for evaluation of his symptoms.  Cystoscopy with suboptimal visualization due to urinary debris, urine cytology resulted with an inadequate sample.  Patient declined CIC teaching for management of incomplete bladder emptying at that time.  He is currently awaiting urodynamics with plans for possible outlet procedure pending results.  Today he reports sudden worsening of right flank pain and onset of diffuse abdominal pain while straining to urinate during a bowel movement around 8 AM this morning.  He states he has been constipated, with passage of only few round, hard stool pellets this morning.  Per CT hematuria dated 03/25/2020, he was noted to have a large stool burden in the right and transverse colon.  In-office catheterized UA today positive for trace glucose, trace ketones, 3+ blood, 2+ protein, 2.0 EU/dL urobilinogen, and 1+ leukocyte esterase; urine microscopy with 11-30 WBCs/HPF, >30 RBCs/HPF, amorphous crystals, and many bacteria. PVR 269mL.  PMH: Past Medical History:  Diagnosis Date  . Cancer (HCC)    skin-melanoma  . Cataract   . Dysrhythmia    PVC, cardiac clearance Dr Ubaldo Glassing 09/25/2017  . Hypertension    controlled on med  . Lesion of larynx   . Wears dentures    upper and lower    Surgical History: Past Surgical History:  Procedure Laterality Date  . CATARACT EXTRACTION, BILATERAL    . DIRECT LARYNGOSCOPY N/A 10/08/2017   Procedure: DIRECT LARYNGOSCOPY WITH BIOPSY OF EPIGLOTIS LESION;  Surgeon: Clyde Canterbury, MD;  Location: [facility];  Service: ENT;   Laterality: N/A;  . HERNIA REPAIR     umbilical  . MELANOMA EXCISION  2018  . MICROLARYNGOSCOPY N/A 01/21/2019   Procedure: MICROLARYNGOSCOPY;  Surgeon: Clyde Canterbury, MD;  Location: [facility];  Service: ENT;  Laterality: N/A;  Needs CO2 laser.  Microdirect Laryngoscopy with excision epiglottis.  Dr. Richardson Landry says this will take about 45 minutes.    Home Medications:  Allergies as of 04/01/2020   No Known Allergies     Medication List       Accurate as of April 01, 2020  1:07 PM. If you have any questions, ask your nurse or doctor.        amLODipine 10 MG tablet Commonly known as: NORVASC Take 10 mg by mouth daily.   buPROPion 150 MG 12 hr tablet Commonly known as: ZYBAN Take 150 mg by mouth 2 (two) times daily.   lisinopril 40 MG tablet Commonly known as: ZESTRIL Take 40 mg by mouth daily. am   polyethylene glycol powder 17 GM/SCOOP powder Commonly known as: GLYCOLAX/MIRALAX 17 g once or twice daily for constipation.   tamsulosin 0.4 MG Caps capsule Commonly known as: FLOMAX Take 1 capsule (0.4 mg total) by mouth daily.       Allergies:  No Known Allergies  Family History: Family History  Problem Relation Age of Onset  . COPD Mother   . Cancer Paternal Uncle        Unknow type of cancer     Social History:   reports that he has been smoking cigarettes. He has a  25.00 pack-year smoking history. He has never used smokeless tobacco. He reports previous alcohol use. He reports previous drug use.  Physical Exam: BP (!) 150/70 (BP Location: Left Arm, Patient Position: Sitting, Cuff Size: Normal)   Pulse (!) 111   Temp 97.7 F (36.5 C) (Oral)   Ht 5\' 4"  (1.626 m)   Wt 145 lb (65.8 kg)   BMI 24.89 kg/m   Constitutional:  Alert and oriented, uncomfortable appearing, nontoxic appearing HEENT: [place], AT Cardiovascular: No clubbing, cyanosis, or edema Respiratory: Normal respiratory effort, no increased work of breathing GI: Abdomen is flat, tympanic, diffusely  tender to deep palpation Skin: No rashes, bruises or suspicious lesions Neurologic: Grossly intact, no focal deficits, moving all 4 extremities Psychiatric: Normal mood and affect  Laboratory Data: Results for orders placed or performed in visit on 04/01/20  Microscopic Examination   Urine  Result Value Ref Range   WBC, UA 11-30 (A) 0 - 5 /hpf   RBC >30 (A) 0 - 2 /hpf   Epithelial Cells (non renal) 0-10 0 - 10 /hpf   Crystals Present (A) N/A   Crystal Type Amorphous Sediment N/A   Bacteria, UA Many (A) None seen/Few  Urinalysis, Complete  Result Value Ref Range   Specific Gravity, UA 1.020 1.005 - 1.030   pH, UA 7.5 5.0 - 7.5   Color, UA Red (A) Yellow   Appearance Ur Cloudy (A) Clear   Leukocytes,UA 1+ (A) Negative   Protein,UA 2+ (A) Negative/Trace   Glucose, UA Trace (A) Negative   Ketones, UA Trace (A) Negative   RBC, UA 3+ (A) Negative   Bilirubin, UA Negative Negative   Urobilinogen, Ur 2.0 (H) 0.2 - 1.0 mg/dL   Nitrite, UA Negative Negative   Microscopic Examination See below:   BLADDER SCAN AMB NON-IMAGING  Result Value Ref Range   Scan Result 205    Assessment & Plan:   1. Generalized abdominal pain Generalized abdominal pain and worse right flank pain over baseline 2 days after cystoscopy.  PVR improved over prior reassuring for retention.  UA today with increased bacteriuria compared to prior.  I suspect his symptoms are related to underlying constipation and post cystoscopy UTI.  Will start empiric Bactrim and send urine for culture for further evaluation.  Counseled patient to manage his constipation with a Fleet enema when he gets home today.  Reviewed return instructions including fever, chills, nausea, vomiting, and worse pain.  Counseled him to proceed to the emergency department over the weekend if he develops these.  He expressed understanding. - BLADDER SCAN AMB NON-IMAGING - Urinalysis, Complete - CULTURE, URINE COMPREHENSIVE -  sulfamethoxazole-trimethoprim (BACTRIM DS) 800-160 MG tablet; Take 1 tablet by mouth 2 (two) times daily for 7 days.  Dispense: 14 tablet; Refill: 0   Return if symptoms worsen or fail to improve.  Debroah Loop, PA-C  [facility] [address], [place], [place] [address] [phone]

## 2020-04-01 NOTE — Telephone Encounter (Signed)
Pt wife calling stating pt was bought home in pain, per wife pt is in severe pain and wants something done. Per wife the pt can not tell her about the visit with Dr. Erlene Quan on 03/30/2020. Wife states he's having prostate, flank or ureter pain? She's not sure which but she knows pt can hardly walk. Please advise

## 2020-04-01 NOTE — Telephone Encounter (Signed)
Pt's wife calling back asking if pt should go to ED, wife is scared, added pt to sam's schedule at 1pm today.

## 2020-04-01 NOTE — Patient Instructions (Signed)
Go to the pharmacy and pick up your antibiotic on your way home today. While you are there, please purchase a Fleet enema and use it when you get home to help with your constipation.  If you develop fever, chills, nausea, vomiting, or worse pain over the weekend, please go to the Emergency Department.

## 2020-04-04 DIAGNOSIS — N1330 Unspecified hydronephrosis: Secondary | ICD-10-CM | POA: Diagnosis not present

## 2020-04-04 DIAGNOSIS — N323 Diverticulum of bladder: Secondary | ICD-10-CM | POA: Diagnosis not present

## 2020-04-04 DIAGNOSIS — Z85828 Personal history of other malignant neoplasm of skin: Secondary | ICD-10-CM | POA: Diagnosis not present

## 2020-04-04 DIAGNOSIS — N2889 Other specified disorders of kidney and ureter: Secondary | ICD-10-CM | POA: Diagnosis not present

## 2020-04-04 DIAGNOSIS — I4891 Unspecified atrial fibrillation: Secondary | ICD-10-CM | POA: Diagnosis not present

## 2020-04-04 DIAGNOSIS — N179 Acute kidney failure, unspecified: Secondary | ICD-10-CM | POA: Diagnosis not present

## 2020-04-04 DIAGNOSIS — N136 Pyonephrosis: Secondary | ICD-10-CM | POA: Diagnosis not present

## 2020-04-04 DIAGNOSIS — A419 Sepsis, unspecified organism: Secondary | ICD-10-CM | POA: Diagnosis not present

## 2020-04-04 DIAGNOSIS — I10 Essential (primary) hypertension: Secondary | ICD-10-CM | POA: Diagnosis not present

## 2020-04-04 DIAGNOSIS — N320 Bladder-neck obstruction: Secondary | ICD-10-CM | POA: Diagnosis not present

## 2020-04-04 DIAGNOSIS — R319 Hematuria, unspecified: Secondary | ICD-10-CM | POA: Diagnosis not present

## 2020-04-04 DIAGNOSIS — N390 Urinary tract infection, site not specified: Secondary | ICD-10-CM | POA: Diagnosis not present

## 2020-04-04 DIAGNOSIS — E860 Dehydration: Secondary | ICD-10-CM | POA: Diagnosis not present

## 2020-04-04 DIAGNOSIS — N138 Other obstructive and reflux uropathy: Secondary | ICD-10-CM | POA: Diagnosis not present

## 2020-04-04 DIAGNOSIS — R9431 Abnormal electrocardiogram [ECG] [EKG]: Secondary | ICD-10-CM | POA: Diagnosis not present

## 2020-04-04 DIAGNOSIS — N3289 Other specified disorders of bladder: Secondary | ICD-10-CM | POA: Diagnosis not present

## 2020-04-04 DIAGNOSIS — R339 Retention of urine, unspecified: Secondary | ICD-10-CM | POA: Diagnosis not present

## 2020-04-04 DIAGNOSIS — Z8679 Personal history of other diseases of the circulatory system: Secondary | ICD-10-CM | POA: Diagnosis not present

## 2020-04-04 DIAGNOSIS — E871 Hypo-osmolality and hyponatremia: Secondary | ICD-10-CM | POA: Diagnosis not present

## 2020-04-04 DIAGNOSIS — I4510 Unspecified right bundle-branch block: Secondary | ICD-10-CM | POA: Diagnosis not present

## 2020-04-04 DIAGNOSIS — E785 Hyperlipidemia, unspecified: Secondary | ICD-10-CM | POA: Diagnosis not present

## 2020-04-04 DIAGNOSIS — K921 Melena: Secondary | ICD-10-CM | POA: Diagnosis not present

## 2020-04-04 DIAGNOSIS — R000 Tachycardia, unspecified: Secondary | ICD-10-CM | POA: Diagnosis not present

## 2020-04-04 DIAGNOSIS — N139 Obstructive and reflux uropathy, unspecified: Secondary | ICD-10-CM | POA: Diagnosis not present

## 2020-04-04 DIAGNOSIS — N368 Other specified disorders of urethra: Secondary | ICD-10-CM | POA: Diagnosis not present

## 2020-04-04 DIAGNOSIS — F17210 Nicotine dependence, cigarettes, uncomplicated: Secondary | ICD-10-CM | POA: Diagnosis not present

## 2020-04-04 DIAGNOSIS — I493 Ventricular premature depolarization: Secondary | ICD-10-CM | POA: Diagnosis not present

## 2020-04-04 DIAGNOSIS — R1030 Lower abdominal pain, unspecified: Secondary | ICD-10-CM | POA: Diagnosis not present

## 2020-04-04 DIAGNOSIS — B957 Other staphylococcus as the cause of diseases classified elsewhere: Secondary | ICD-10-CM | POA: Diagnosis not present

## 2020-04-04 LAB — CULTURE, URINE COMPREHENSIVE

## 2020-04-05 ENCOUNTER — Telehealth: Payer: Self-pay | Admitting: Physician Assistant

## 2020-04-05 LAB — CULTURE, URINE COMPREHENSIVE

## 2020-04-05 MED ORDER — CIPROFLOXACIN HCL 250 MG PO TABS: 250.0000 mg | ORAL_TABLET | Freq: Two times a day (BID) | ORAL | 0 refills | Status: AC

## 2020-04-05 NOTE — Telephone Encounter (Signed)
Please contact the patient and inform him that his urine culture results have come back.  He does have a urinary tract infection, however the bacteria he is growing is resistant to the Bactrim that I prescribed.  I would like him to stop this antibiotic and switch to Cipro 250 mg twice daily x7 days.  I have sent this prescription to [facility].

## 2020-04-05 NOTE — Telephone Encounter (Signed)
Notified patient as advised, per DPR on file, LMOM with instructions to stop current ABX and pick up new Rx. Advised patient to call back if he had any questions.

## 2020-04-05 NOTE — Telephone Encounter (Signed)
Please see prior telephone note 

## 2020-04-14 DIAGNOSIS — Z436 Encounter for attention to other artificial openings of urinary tract: Secondary | ICD-10-CM | POA: Diagnosis not present

## 2020-04-14 DIAGNOSIS — F17210 Nicotine dependence, cigarettes, uncomplicated: Secondary | ICD-10-CM | POA: Diagnosis not present

## 2020-04-14 DIAGNOSIS — N1339 Other hydronephrosis: Secondary | ICD-10-CM | POA: Diagnosis not present

## 2020-04-14 DIAGNOSIS — N138 Other obstructive and reflux uropathy: Secondary | ICD-10-CM | POA: Diagnosis not present

## 2020-04-14 DIAGNOSIS — E785 Hyperlipidemia, unspecified: Secondary | ICD-10-CM | POA: Diagnosis not present

## 2020-04-14 DIAGNOSIS — Z466 Encounter for fitting and adjustment of urinary device: Secondary | ICD-10-CM | POA: Diagnosis not present

## 2020-04-14 DIAGNOSIS — I10 Essential (primary) hypertension: Secondary | ICD-10-CM | POA: Diagnosis not present

## 2020-04-14 DIAGNOSIS — Z85820 Personal history of malignant melanoma of skin: Secondary | ICD-10-CM | POA: Diagnosis not present

## 2020-04-18 DIAGNOSIS — N138 Other obstructive and reflux uropathy: Secondary | ICD-10-CM | POA: Diagnosis not present

## 2020-04-18 DIAGNOSIS — Z466 Encounter for fitting and adjustment of urinary device: Secondary | ICD-10-CM | POA: Diagnosis not present

## 2020-04-18 DIAGNOSIS — N1339 Other hydronephrosis: Secondary | ICD-10-CM | POA: Diagnosis not present

## 2020-04-18 DIAGNOSIS — F17210 Nicotine dependence, cigarettes, uncomplicated: Secondary | ICD-10-CM | POA: Diagnosis not present

## 2020-04-18 DIAGNOSIS — E785 Hyperlipidemia, unspecified: Secondary | ICD-10-CM | POA: Diagnosis not present

## 2020-04-18 DIAGNOSIS — I10 Essential (primary) hypertension: Secondary | ICD-10-CM | POA: Diagnosis not present

## 2020-04-18 DIAGNOSIS — Z436 Encounter for attention to other artificial openings of urinary tract: Secondary | ICD-10-CM | POA: Diagnosis not present

## 2020-04-18 DIAGNOSIS — Z85820 Personal history of malignant melanoma of skin: Secondary | ICD-10-CM | POA: Diagnosis not present

## 2020-04-19 DIAGNOSIS — N138 Other obstructive and reflux uropathy: Secondary | ICD-10-CM | POA: Diagnosis not present

## 2020-04-19 DIAGNOSIS — F17210 Nicotine dependence, cigarettes, uncomplicated: Secondary | ICD-10-CM | POA: Diagnosis not present

## 2020-04-19 DIAGNOSIS — Z466 Encounter for fitting and adjustment of urinary device: Secondary | ICD-10-CM | POA: Diagnosis not present

## 2020-04-19 DIAGNOSIS — I10 Essential (primary) hypertension: Secondary | ICD-10-CM | POA: Diagnosis not present

## 2020-04-19 DIAGNOSIS — Z85820 Personal history of malignant melanoma of skin: Secondary | ICD-10-CM | POA: Diagnosis not present

## 2020-04-19 DIAGNOSIS — E785 Hyperlipidemia, unspecified: Secondary | ICD-10-CM | POA: Diagnosis not present

## 2020-04-19 DIAGNOSIS — Z436 Encounter for attention to other artificial openings of urinary tract: Secondary | ICD-10-CM | POA: Diagnosis not present

## 2020-04-19 DIAGNOSIS — N1339 Other hydronephrosis: Secondary | ICD-10-CM | POA: Diagnosis not present

## 2020-04-22 DIAGNOSIS — N1339 Other hydronephrosis: Secondary | ICD-10-CM | POA: Diagnosis not present

## 2020-04-22 DIAGNOSIS — Z466 Encounter for fitting and adjustment of urinary device: Secondary | ICD-10-CM | POA: Diagnosis not present

## 2020-04-22 DIAGNOSIS — Z436 Encounter for attention to other artificial openings of urinary tract: Secondary | ICD-10-CM | POA: Diagnosis not present

## 2020-04-22 DIAGNOSIS — Z85820 Personal history of malignant melanoma of skin: Secondary | ICD-10-CM | POA: Diagnosis not present

## 2020-04-22 DIAGNOSIS — E785 Hyperlipidemia, unspecified: Secondary | ICD-10-CM | POA: Diagnosis not present

## 2020-04-22 DIAGNOSIS — I10 Essential (primary) hypertension: Secondary | ICD-10-CM | POA: Diagnosis not present

## 2020-04-22 DIAGNOSIS — N138 Other obstructive and reflux uropathy: Secondary | ICD-10-CM | POA: Diagnosis not present

## 2020-04-22 DIAGNOSIS — F17210 Nicotine dependence, cigarettes, uncomplicated: Secondary | ICD-10-CM | POA: Diagnosis not present

## 2020-04-25 DIAGNOSIS — Z436 Encounter for attention to other artificial openings of urinary tract: Secondary | ICD-10-CM | POA: Diagnosis not present

## 2020-04-25 DIAGNOSIS — F17210 Nicotine dependence, cigarettes, uncomplicated: Secondary | ICD-10-CM | POA: Diagnosis not present

## 2020-04-25 DIAGNOSIS — N1339 Other hydronephrosis: Secondary | ICD-10-CM | POA: Diagnosis not present

## 2020-04-25 DIAGNOSIS — Z85820 Personal history of malignant melanoma of skin: Secondary | ICD-10-CM | POA: Diagnosis not present

## 2020-04-25 DIAGNOSIS — E785 Hyperlipidemia, unspecified: Secondary | ICD-10-CM | POA: Diagnosis not present

## 2020-04-25 DIAGNOSIS — Z466 Encounter for fitting and adjustment of urinary device: Secondary | ICD-10-CM | POA: Diagnosis not present

## 2020-04-25 DIAGNOSIS — N138 Other obstructive and reflux uropathy: Secondary | ICD-10-CM | POA: Diagnosis not present

## 2020-04-25 DIAGNOSIS — I10 Essential (primary) hypertension: Secondary | ICD-10-CM | POA: Diagnosis not present

## 2020-04-26 DIAGNOSIS — I10 Essential (primary) hypertension: Secondary | ICD-10-CM | POA: Diagnosis not present

## 2020-04-26 DIAGNOSIS — E785 Hyperlipidemia, unspecified: Secondary | ICD-10-CM | POA: Diagnosis not present

## 2020-04-26 DIAGNOSIS — Z436 Encounter for attention to other artificial openings of urinary tract: Secondary | ICD-10-CM | POA: Diagnosis not present

## 2020-04-26 DIAGNOSIS — N138 Other obstructive and reflux uropathy: Secondary | ICD-10-CM | POA: Diagnosis not present

## 2020-04-26 DIAGNOSIS — N1339 Other hydronephrosis: Secondary | ICD-10-CM | POA: Diagnosis not present

## 2020-04-26 DIAGNOSIS — F17210 Nicotine dependence, cigarettes, uncomplicated: Secondary | ICD-10-CM | POA: Diagnosis not present

## 2020-04-26 DIAGNOSIS — Z466 Encounter for fitting and adjustment of urinary device: Secondary | ICD-10-CM | POA: Diagnosis not present

## 2020-04-26 DIAGNOSIS — Z85820 Personal history of malignant melanoma of skin: Secondary | ICD-10-CM | POA: Diagnosis not present

## 2020-04-27 ENCOUNTER — Ambulatory Visit: Payer: Self-pay | Admitting: Urology

## 2020-04-27 DIAGNOSIS — Z8521 Personal history of malignant neoplasm of larynx: Secondary | ICD-10-CM | POA: Diagnosis not present

## 2020-04-27 DIAGNOSIS — Z08 Encounter for follow-up examination after completed treatment for malignant neoplasm: Secondary | ICD-10-CM | POA: Diagnosis not present

## 2020-04-28 DIAGNOSIS — N1339 Other hydronephrosis: Secondary | ICD-10-CM | POA: Diagnosis not present

## 2020-04-28 DIAGNOSIS — Z466 Encounter for fitting and adjustment of urinary device: Secondary | ICD-10-CM | POA: Diagnosis not present

## 2020-04-28 DIAGNOSIS — N138 Other obstructive and reflux uropathy: Secondary | ICD-10-CM | POA: Diagnosis not present

## 2020-04-28 DIAGNOSIS — I10 Essential (primary) hypertension: Secondary | ICD-10-CM | POA: Diagnosis not present

## 2020-04-28 DIAGNOSIS — E785 Hyperlipidemia, unspecified: Secondary | ICD-10-CM | POA: Diagnosis not present

## 2020-04-28 DIAGNOSIS — Z85820 Personal history of malignant melanoma of skin: Secondary | ICD-10-CM | POA: Diagnosis not present

## 2020-04-28 DIAGNOSIS — F17210 Nicotine dependence, cigarettes, uncomplicated: Secondary | ICD-10-CM | POA: Diagnosis not present

## 2020-04-28 DIAGNOSIS — Z436 Encounter for attention to other artificial openings of urinary tract: Secondary | ICD-10-CM | POA: Diagnosis not present

## 2020-04-29 DIAGNOSIS — N1330 Unspecified hydronephrosis: Secondary | ICD-10-CM | POA: Diagnosis not present

## 2020-04-29 DIAGNOSIS — I10 Essential (primary) hypertension: Secondary | ICD-10-CM | POA: Diagnosis not present

## 2020-05-03 DIAGNOSIS — R030 Elevated blood-pressure reading, without diagnosis of hypertension: Secondary | ICD-10-CM | POA: Diagnosis not present

## 2020-05-03 DIAGNOSIS — K921 Melena: Secondary | ICD-10-CM | POA: Diagnosis not present

## 2020-05-03 DIAGNOSIS — Z936 Other artificial openings of urinary tract status: Secondary | ICD-10-CM | POA: Diagnosis not present

## 2020-05-03 DIAGNOSIS — N1330 Unspecified hydronephrosis: Secondary | ICD-10-CM | POA: Diagnosis not present

## 2020-05-03 DIAGNOSIS — Z1389 Encounter for screening for other disorder: Secondary | ICD-10-CM | POA: Diagnosis not present

## 2020-05-04 DIAGNOSIS — Z466 Encounter for fitting and adjustment of urinary device: Secondary | ICD-10-CM | POA: Diagnosis not present

## 2020-05-04 DIAGNOSIS — N1339 Other hydronephrosis: Secondary | ICD-10-CM | POA: Diagnosis not present

## 2020-05-04 DIAGNOSIS — E785 Hyperlipidemia, unspecified: Secondary | ICD-10-CM | POA: Diagnosis not present

## 2020-05-04 DIAGNOSIS — Z436 Encounter for attention to other artificial openings of urinary tract: Secondary | ICD-10-CM | POA: Diagnosis not present

## 2020-05-04 DIAGNOSIS — N138 Other obstructive and reflux uropathy: Secondary | ICD-10-CM | POA: Diagnosis not present

## 2020-05-04 DIAGNOSIS — I10 Essential (primary) hypertension: Secondary | ICD-10-CM | POA: Diagnosis not present

## 2020-05-04 DIAGNOSIS — F17210 Nicotine dependence, cigarettes, uncomplicated: Secondary | ICD-10-CM | POA: Diagnosis not present

## 2020-05-04 DIAGNOSIS — Z85820 Personal history of malignant melanoma of skin: Secondary | ICD-10-CM | POA: Diagnosis not present

## 2020-05-06 DIAGNOSIS — N1339 Other hydronephrosis: Secondary | ICD-10-CM | POA: Diagnosis not present

## 2020-05-06 DIAGNOSIS — Z436 Encounter for attention to other artificial openings of urinary tract: Secondary | ICD-10-CM | POA: Diagnosis not present

## 2020-05-06 DIAGNOSIS — Z466 Encounter for fitting and adjustment of urinary device: Secondary | ICD-10-CM | POA: Diagnosis not present

## 2020-05-06 DIAGNOSIS — N138 Other obstructive and reflux uropathy: Secondary | ICD-10-CM | POA: Diagnosis not present

## 2020-05-10 DIAGNOSIS — N138 Other obstructive and reflux uropathy: Secondary | ICD-10-CM | POA: Diagnosis not present

## 2020-05-10 DIAGNOSIS — T83098A Other mechanical complication of other indwelling urethral catheter, initial encounter: Secondary | ICD-10-CM | POA: Diagnosis not present

## 2020-05-10 DIAGNOSIS — Z466 Encounter for fitting and adjustment of urinary device: Secondary | ICD-10-CM | POA: Diagnosis not present

## 2020-05-10 DIAGNOSIS — Z85820 Personal history of malignant melanoma of skin: Secondary | ICD-10-CM | POA: Diagnosis not present

## 2020-05-10 DIAGNOSIS — Z436 Encounter for attention to other artificial openings of urinary tract: Secondary | ICD-10-CM | POA: Diagnosis not present

## 2020-05-10 DIAGNOSIS — Z825 Family history of asthma and other chronic lower respiratory diseases: Secondary | ICD-10-CM | POA: Diagnosis not present

## 2020-05-10 DIAGNOSIS — E785 Hyperlipidemia, unspecified: Secondary | ICD-10-CM | POA: Diagnosis not present

## 2020-05-10 DIAGNOSIS — Z79899 Other long term (current) drug therapy: Secondary | ICD-10-CM | POA: Diagnosis not present

## 2020-05-10 DIAGNOSIS — I10 Essential (primary) hypertension: Secondary | ICD-10-CM | POA: Diagnosis not present

## 2020-05-10 DIAGNOSIS — Z936 Other artificial openings of urinary tract status: Secondary | ICD-10-CM | POA: Diagnosis not present

## 2020-05-10 DIAGNOSIS — N1339 Other hydronephrosis: Secondary | ICD-10-CM | POA: Diagnosis not present

## 2020-05-10 DIAGNOSIS — F17210 Nicotine dependence, cigarettes, uncomplicated: Secondary | ICD-10-CM | POA: Diagnosis not present

## 2020-06-16 DIAGNOSIS — N1330 Unspecified hydronephrosis: Secondary | ICD-10-CM | POA: Diagnosis not present

## 2020-06-16 DIAGNOSIS — N3090 Cystitis, unspecified without hematuria: Secondary | ICD-10-CM | POA: Diagnosis not present

## 2020-06-17 DIAGNOSIS — R339 Retention of urine, unspecified: Secondary | ICD-10-CM | POA: Diagnosis not present

## 2020-06-17 DIAGNOSIS — Z79899 Other long term (current) drug therapy: Secondary | ICD-10-CM | POA: Diagnosis not present

## 2020-06-17 DIAGNOSIS — N3001 Acute cystitis with hematuria: Secondary | ICD-10-CM | POA: Diagnosis not present

## 2020-06-17 DIAGNOSIS — N1330 Unspecified hydronephrosis: Secondary | ICD-10-CM | POA: Diagnosis not present

## 2020-06-17 DIAGNOSIS — F17210 Nicotine dependence, cigarettes, uncomplicated: Secondary | ICD-10-CM | POA: Diagnosis not present

## 2020-06-17 DIAGNOSIS — I10 Essential (primary) hypertension: Secondary | ICD-10-CM | POA: Diagnosis not present

## 2020-06-17 DIAGNOSIS — K219 Gastro-esophageal reflux disease without esophagitis: Secondary | ICD-10-CM | POA: Diagnosis not present

## 2020-06-17 DIAGNOSIS — I493 Ventricular premature depolarization: Secondary | ICD-10-CM | POA: Diagnosis not present

## 2020-06-17 DIAGNOSIS — Z936 Other artificial openings of urinary tract status: Secondary | ICD-10-CM | POA: Diagnosis not present

## 2020-06-24 DIAGNOSIS — Z01812 Encounter for preprocedural laboratory examination: Secondary | ICD-10-CM | POA: Diagnosis not present

## 2020-06-24 DIAGNOSIS — Z20822 Contact with and (suspected) exposure to covid-19: Secondary | ICD-10-CM | POA: Diagnosis not present

## 2020-06-27 DIAGNOSIS — N138 Other obstructive and reflux uropathy: Secondary | ICD-10-CM | POA: Diagnosis not present

## 2020-06-27 DIAGNOSIS — R339 Retention of urine, unspecified: Secondary | ICD-10-CM | POA: Diagnosis not present

## 2020-06-27 DIAGNOSIS — N13731 Vesicoureteral-reflux with reflux nephropathy with hydroureter, unilateral: Secondary | ICD-10-CM | POA: Diagnosis not present

## 2020-06-27 DIAGNOSIS — R338 Other retention of urine: Secondary | ICD-10-CM | POA: Diagnosis not present

## 2020-06-28 DIAGNOSIS — Z960 Presence of urogenital implants: Secondary | ICD-10-CM | POA: Diagnosis not present

## 2020-06-30 DIAGNOSIS — T83192A Other mechanical complication of urinary stent, initial encounter: Secondary | ICD-10-CM | POA: Diagnosis not present

## 2020-06-30 DIAGNOSIS — Z955 Presence of coronary angioplasty implant and graft: Secondary | ICD-10-CM | POA: Diagnosis not present

## 2020-06-30 DIAGNOSIS — N16 Renal tubulo-interstitial disorders in diseases classified elsewhere: Secondary | ICD-10-CM | POA: Diagnosis not present

## 2020-06-30 DIAGNOSIS — E785 Hyperlipidemia, unspecified: Secondary | ICD-10-CM | POA: Diagnosis not present

## 2020-06-30 DIAGNOSIS — R338 Other retention of urine: Secondary | ICD-10-CM | POA: Diagnosis not present

## 2020-06-30 DIAGNOSIS — E876 Hypokalemia: Secondary | ICD-10-CM | POA: Diagnosis not present

## 2020-06-30 DIAGNOSIS — F17210 Nicotine dependence, cigarettes, uncomplicated: Secondary | ICD-10-CM | POA: Diagnosis not present

## 2020-06-30 DIAGNOSIS — N390 Urinary tract infection, site not specified: Secondary | ICD-10-CM | POA: Diagnosis not present

## 2020-06-30 DIAGNOSIS — A4152 Sepsis due to Pseudomonas: Secondary | ICD-10-CM | POA: Diagnosis not present

## 2020-06-30 DIAGNOSIS — T83122A Displacement of urinary stent, initial encounter: Secondary | ICD-10-CM | POA: Diagnosis not present

## 2020-06-30 DIAGNOSIS — N136 Pyonephrosis: Secondary | ICD-10-CM | POA: Diagnosis not present

## 2020-06-30 DIAGNOSIS — R9341 Abnormal radiologic findings on diagnostic imaging of renal pelvis, ureter, or bladder: Secondary | ICD-10-CM | POA: Diagnosis not present

## 2020-06-30 DIAGNOSIS — I10 Essential (primary) hypertension: Secondary | ICD-10-CM | POA: Diagnosis not present

## 2020-06-30 DIAGNOSIS — T83593A Infection and inflammatory reaction due to other urinary stents, initial encounter: Secondary | ICD-10-CM | POA: Diagnosis not present

## 2020-06-30 DIAGNOSIS — R935 Abnormal findings on diagnostic imaging of other abdominal regions, including retroperitoneum: Secondary | ICD-10-CM | POA: Diagnosis not present

## 2020-06-30 DIAGNOSIS — Z20822 Contact with and (suspected) exposure to covid-19: Secondary | ICD-10-CM | POA: Diagnosis not present

## 2020-06-30 DIAGNOSIS — T8144XA Sepsis following a procedure, initial encounter: Secondary | ICD-10-CM | POA: Diagnosis not present

## 2020-06-30 DIAGNOSIS — N059 Unspecified nephritic syndrome with unspecified morphologic changes: Secondary | ICD-10-CM | POA: Diagnosis not present

## 2020-06-30 DIAGNOSIS — A419 Sepsis, unspecified organism: Secondary | ICD-10-CM | POA: Diagnosis not present

## 2020-06-30 DIAGNOSIS — R509 Fever, unspecified: Secondary | ICD-10-CM | POA: Diagnosis not present

## 2020-06-30 DIAGNOSIS — R590 Localized enlarged lymph nodes: Secondary | ICD-10-CM | POA: Diagnosis not present

## 2020-07-15 DIAGNOSIS — N1370 Vesicoureteral-reflux, unspecified: Secondary | ICD-10-CM | POA: Diagnosis not present

## 2020-07-15 DIAGNOSIS — R339 Retention of urine, unspecified: Secondary | ICD-10-CM | POA: Diagnosis not present

## 2020-07-15 DIAGNOSIS — Z466 Encounter for fitting and adjustment of urinary device: Secondary | ICD-10-CM | POA: Diagnosis not present

## 2020-08-08 DIAGNOSIS — N1370 Vesicoureteral-reflux, unspecified: Secondary | ICD-10-CM | POA: Diagnosis not present

## 2020-08-08 DIAGNOSIS — N1330 Unspecified hydronephrosis: Secondary | ICD-10-CM | POA: Diagnosis not present

## 2020-08-08 DIAGNOSIS — N281 Cyst of kidney, acquired: Secondary | ICD-10-CM | POA: Diagnosis not present

## 2020-08-08 DIAGNOSIS — R339 Retention of urine, unspecified: Secondary | ICD-10-CM | POA: Diagnosis not present

## 2020-08-08 DIAGNOSIS — N323 Diverticulum of bladder: Secondary | ICD-10-CM | POA: Diagnosis not present

## 2020-08-08 DIAGNOSIS — R9341 Abnormal radiologic findings on diagnostic imaging of renal pelvis, ureter, or bladder: Secondary | ICD-10-CM | POA: Diagnosis not present

## 2020-08-26 DIAGNOSIS — F17200 Nicotine dependence, unspecified, uncomplicated: Secondary | ICD-10-CM | POA: Diagnosis not present

## 2020-08-26 DIAGNOSIS — Z8521 Personal history of malignant neoplasm of larynx: Secondary | ICD-10-CM | POA: Diagnosis not present

## 2020-12-15 IMAGING — CR DG ABDOMEN 1V
2 series · 2 of 2 positions shown · non-contrast
Comparison: None.

CLINICAL DATA: Patient c/o dysuria that started 3 days ago. milky
white urine. Some trouble urinating.

EXAM:
ABDOMEN - 1 VIEW

[abdomen kub (1 of 2)]
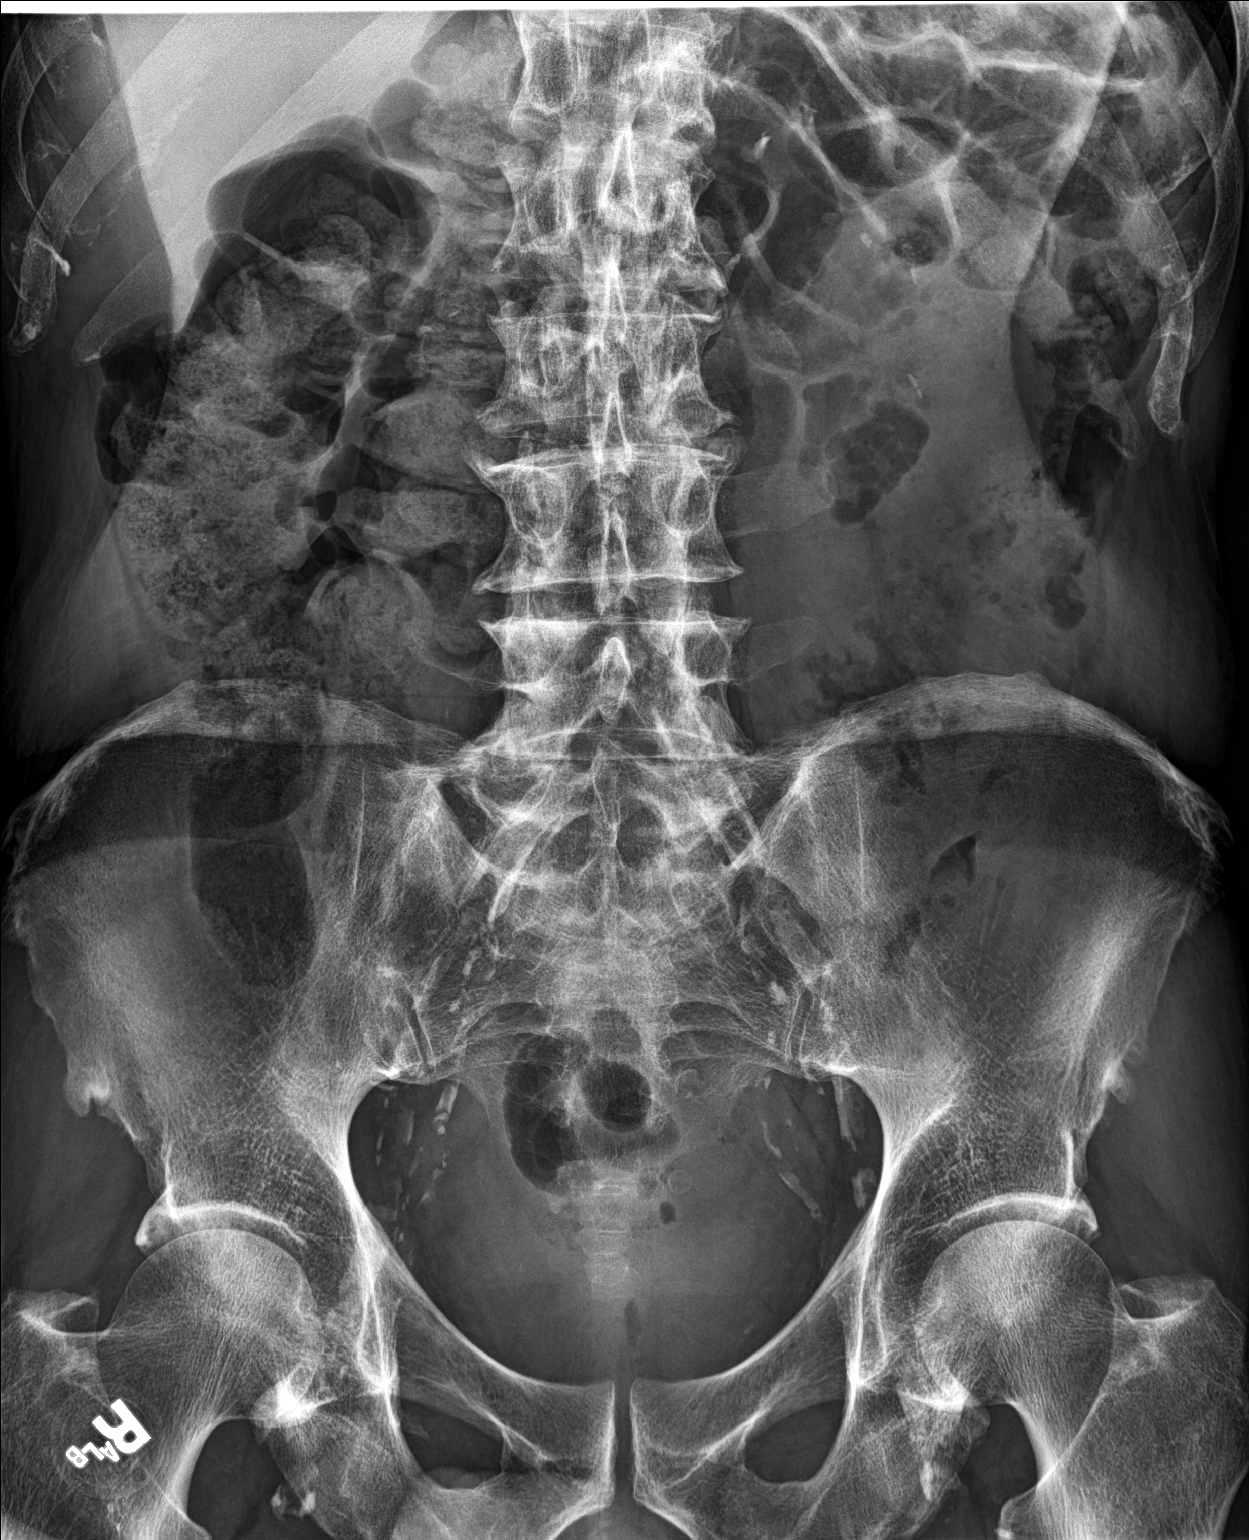

[abdomen kub (2 of 2)]
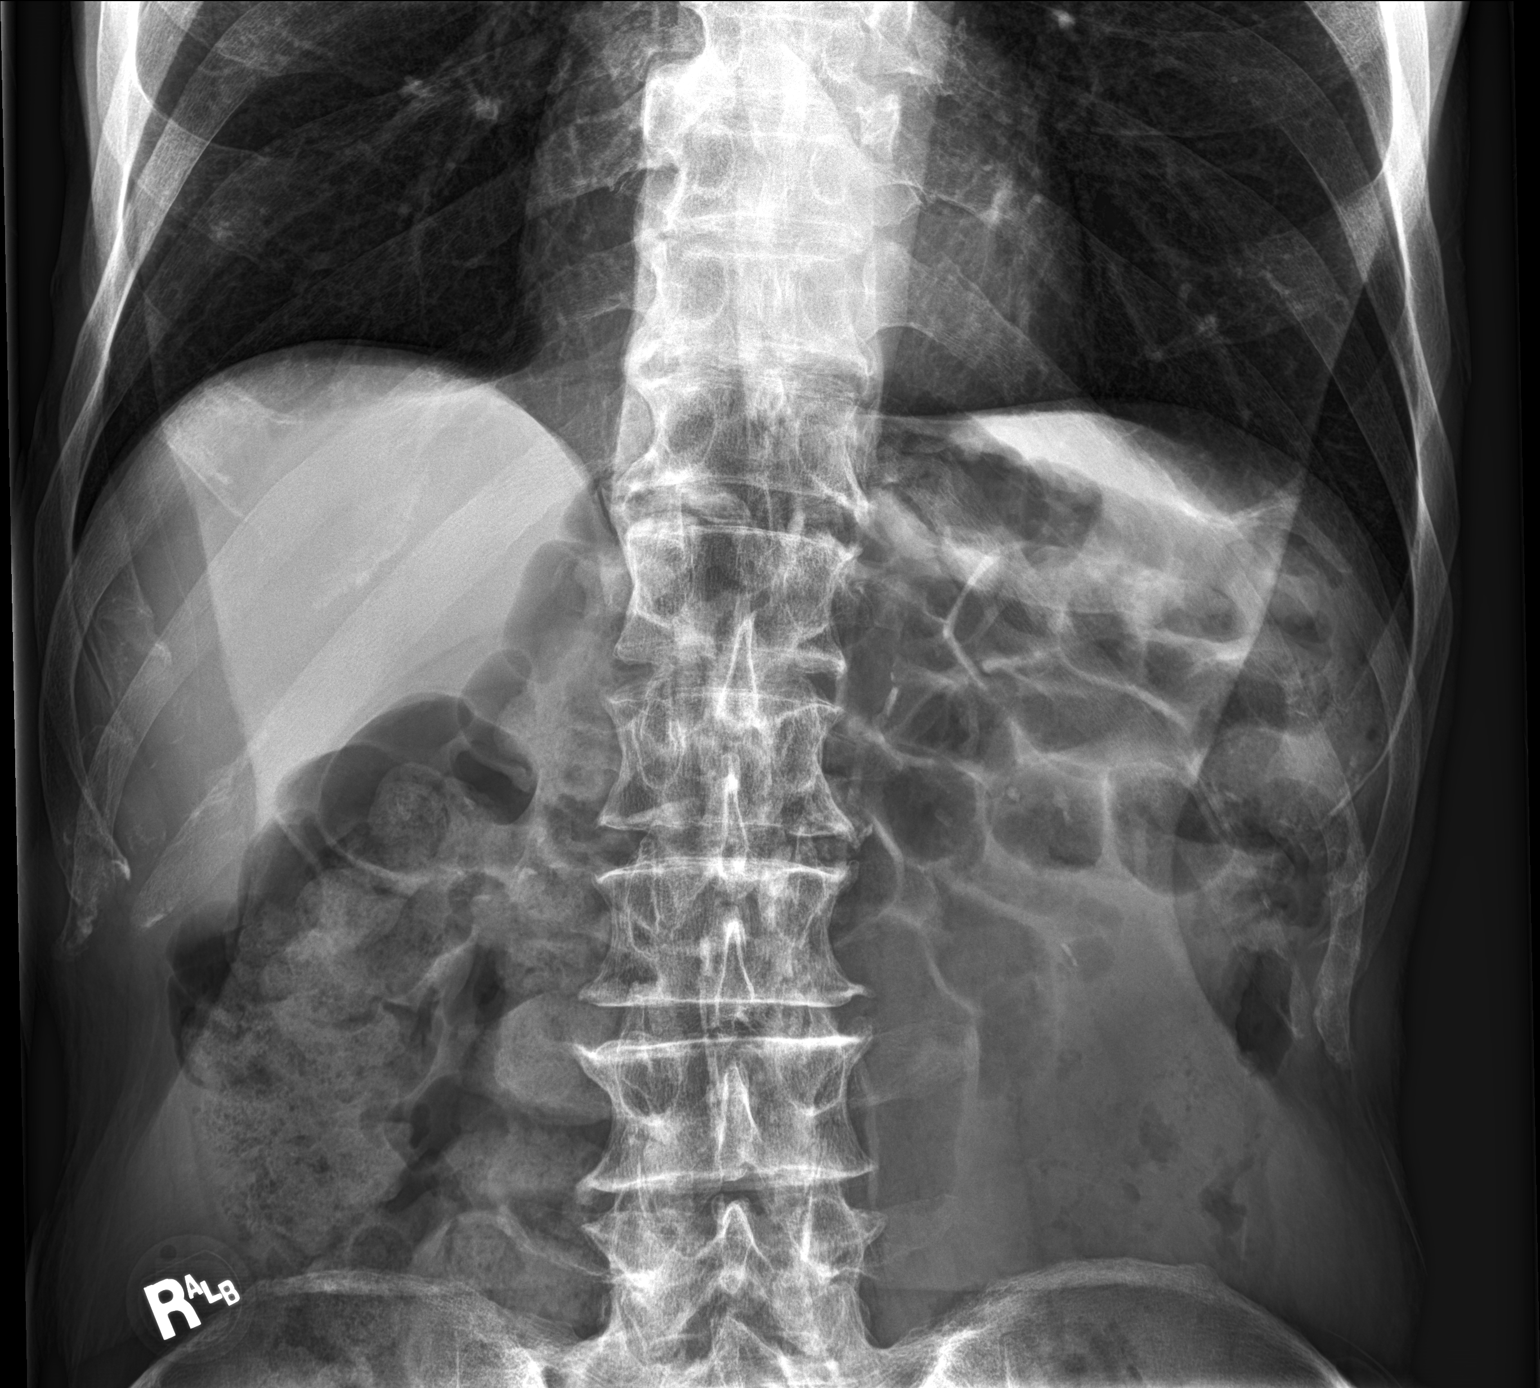

[2 of 2 positions shown; findings below may reference images not displayed]

FINDINGS: Large amount of stool in the ascending colon. no bowel dilatation to
suggest obstruction. No evidence of pneumoperitoneum, portal venous
gas or pneumatosis.

No pathologic calcifications along the expected course of the
ureters. Bilateral nephrolithiasis.

No acute osseous abnormality. Peripheral vascular atherosclerotic
disease.
IMPRESSION: Bilateral nephrolithiasis.

Peripheral vascular atherosclerotic disease.

## 2021-01-09 DIAGNOSIS — Z8521 Personal history of malignant neoplasm of larynx: Secondary | ICD-10-CM | POA: Diagnosis not present

## 2021-03-21 DIAGNOSIS — Z1389 Encounter for screening for other disorder: Secondary | ICD-10-CM | POA: Diagnosis not present

## 2021-03-21 DIAGNOSIS — Z23 Encounter for immunization: Secondary | ICD-10-CM | POA: Diagnosis not present

## 2021-03-21 DIAGNOSIS — Z0000 Encounter for general adult medical examination without abnormal findings: Secondary | ICD-10-CM | POA: Diagnosis not present

## 2021-03-21 DIAGNOSIS — Z1211 Encounter for screening for malignant neoplasm of colon: Secondary | ICD-10-CM | POA: Diagnosis not present

## 2021-09-18 DEATH — deceased
